# Patient Record
Sex: Male | Born: 1960
Health system: Southern US, Community
[De-identification: ages and names within clinical notes are randomized; demographics above are authoritative.]

## PROBLEM LIST (undated history)

## (undated) DIAGNOSIS — I10 Essential (primary) hypertension: Secondary | ICD-10-CM

## (undated) DIAGNOSIS — N189 Chronic kidney disease, unspecified: Secondary | ICD-10-CM

## (undated) DIAGNOSIS — E785 Hyperlipidemia, unspecified: Secondary | ICD-10-CM

## (undated) DIAGNOSIS — I639 Cerebral infarction, unspecified: Secondary | ICD-10-CM

## (undated) HISTORY — PX: OTHER SURGICAL HISTORY: SHX169

## (undated) HISTORY — PX: LITHOTRIPSY: SUR834

## (undated) HISTORY — DX: Chronic kidney disease, unspecified: N18.9

## (undated) HISTORY — DX: Hyperlipidemia, unspecified: E78.5

## (undated) HISTORY — DX: Cerebral infarction, unspecified: I63.9

## (undated) HISTORY — DX: Essential (primary) hypertension: I10

---

## 1998-02-02 ENCOUNTER — Ambulatory Visit (HOSPITAL_COMMUNITY): Admission: RE | Admit: 1998-02-02 | Discharge: 1998-02-02 | Payer: Self-pay | Admitting: Specialist

## 1998-02-02 ENCOUNTER — Encounter: Payer: Self-pay | Admitting: General Surgery

## 1998-02-16 ENCOUNTER — Encounter: Admission: RE | Admit: 1998-02-16 | Discharge: 1998-03-17 | Payer: Self-pay | Admitting: Specialist

## 2007-08-07 ENCOUNTER — Ambulatory Visit: Payer: Self-pay | Admitting: *Deleted

## 2009-09-25 ENCOUNTER — Ambulatory Visit: Payer: Self-pay | Admitting: Vascular Surgery

## 2010-06-22 NOTE — Procedures (Signed)
CAROTID DUPLEX EXAM   INDICATION:  Left carotid bruit.   HISTORY:  Diabetes:  No.  Cardiac:  No.  Hypertension:  No.  Smoking:  Yes, 1-1/2 packs per day.  Previous Surgery:  No.  CV History:  No.  Amaurosis Fugax No, Paresthesias No, Hemiparesis No                                        RIGHT             LEFT  Brachial systolic pressure:         126               140  Brachial Doppler waveforms:         WNL               WNL  Vertebral direction of flow:        Antegrade (atypical)                Antegrade  DUPLEX VELOCITIES (cm/sec)  CCA peak systolic                   161 (P), 94 (M/D) 102  ECA peak systolic                   147               353  ICA peak systolic                   120               83  ICA end diastolic                   34                34  PLAQUE MORPHOLOGY:                  Mixed             Mixed  PLAQUE AMOUNT:                      Mild-moderate     Mild  PLAQUE LOCATION:                    ICA               ICA, ECA   IMPRESSION:  1. Right 40-59% internal carotid artery stenosis.  2. Left 1-39% internal carotid artery stenosis.  3. Left external carotid artery stenosis.  4. Bilateral antegrade flow in vertebral arteries; however, right      vertebral artery demonstrates early systolic deceleration,      consistent with subclavian stenosis.  5. Bilateral internal carotid artery velocities, essentially unchanged      from that on 07/26/05.   Preliminary report was faxed to Dr. Silvano Rusk office at 4 p.m. on  08/07/07.      ___________________________________________  P. Liliane Bade, M.D.   PB/MEDQ  D:  08/07/2007  T:  08/07/2007  Job:  [id number]

## 2010-06-22 NOTE — Procedures (Signed)
CAROTID DUPLEX EXAM   INDICATION:  Carotid bruit with known carotid stenosis.   HISTORY:  Diabetes:  No.  Cardiac:  No.  Hypertension:  No.  Smoking:  Yes.  Previous Surgery:  No.  CV History:  No.  Amaurosis Fugax No, Paresthesias No, Hemiparesis No                                       RIGHT               LEFT  Brachial systolic pressure:         156                 150  Brachial Doppler waveforms:         WNL                 WNL  Vertebral direction of flow:        Antegrade           Antegrade  DUPLEX VELOCITIES (cm/sec)  CCA peak systolic                   104                 112  ECA peak systolic                   193                 281  ICA peak systolic                   130                 92  ICA end diastolic                   43                  42  PLAQUE MORPHOLOGY:                  Mixed               Mixed  PLAQUE AMOUNT:                      Mild / moderate     Mild  PLAQUE LOCATION:                    Bifurcation/ICA/ECA  Bifurcation/ICA/ECA   IMPRESSION:  1. Right internal carotid artery shows evidence of 40%-59% stenosis      (low end of range).  2. Left internal carotid artery shows no evidence of hemodynamically      significant stenosis, <40%.  3. Bilateral external carotid artery stenosis.  4. No significant changes from previous study 08/07/2007.       ___________________________________________  Larina Earthly, M.D.   AS/MEDQ  D:  09/25/2009  T:  09/25/2009  Job:  [id number]

## 2013-05-06 ENCOUNTER — Ambulatory Visit (HOSPITAL_COMMUNITY)
Admission: RE | Admit: 2013-05-06 | Discharge: 2013-05-06 | Disposition: A | Discharge status: Home / Self Care | Payer: BC Managed Care – PPO | Source: Ambulatory Visit | Attending: Vascular Surgery | Admitting: Vascular Surgery

## 2013-05-06 ENCOUNTER — Other Ambulatory Visit (HOSPITAL_COMMUNITY): Payer: Self-pay | Admitting: Internal Medicine

## 2013-05-06 DIAGNOSIS — R0989 Other specified symptoms and signs involving the circulatory and respiratory systems: Secondary | ICD-10-CM

## 2013-11-11 ENCOUNTER — Encounter: Payer: Self-pay | Admitting: Gastroenterology

## 2013-12-23 ENCOUNTER — Ambulatory Visit (AMBULATORY_SURGERY_CENTER): Payer: Self-pay | Admitting: *Deleted

## 2013-12-23 VITALS — Ht 68.0 in | Wt 229.0 lb

## 2013-12-23 DIAGNOSIS — Z1211 Encounter for screening for malignant neoplasm of colon: Secondary | ICD-10-CM

## 2013-12-23 MED ORDER — MOVIPREP 100 G PO SOLR: 1.0000 | Freq: Once | ORAL | Status: DC

## 2013-12-23 NOTE — Progress Notes (Signed)
No egg or soy allergy. ewm No home 02 use. ewm No diet pills. ewm No issues with past sedation. ewm Declined emmi video. ewm

## 2014-01-06 ENCOUNTER — Encounter: Payer: Self-pay | Admitting: Gastroenterology

## 2014-01-06 ENCOUNTER — Ambulatory Visit (AMBULATORY_SURGERY_CENTER): Payer: BC Managed Care – PPO | Admitting: Gastroenterology

## 2014-01-06 VITALS — BP 117/72 | HR 59 | Temp 97.0°F | Resp 23 | Ht 68.0 in | Wt 229.0 lb

## 2014-01-06 DIAGNOSIS — Z1211 Encounter for screening for malignant neoplasm of colon: Secondary | ICD-10-CM

## 2014-01-06 DIAGNOSIS — K621 Rectal polyp: Secondary | ICD-10-CM

## 2014-01-06 MED ORDER — SODIUM CHLORIDE 0.9 % IV SOLN: 500.0000 mL | INTRAVENOUS | Status: DC

## 2014-01-06 NOTE — Op Note (Signed)
[facility] [address] [place], [id number]   COLONOSCOPY PROCEDURE REPORT  PATIENT: Francisco Gross, Francisco Gross  MR#: [id number] BIRTHDATE: [birth year] , 53  yrs. old GENDER: male ENDOSCOPIST: Rachael Feeaniel P Donshay Lupinski, MD REFERRED HQ:IONGEXBY:Mariel Gaudin Eloise HarmanPaterson, M.D. PROCEDURE DATE:  01/06/2014 PROCEDURE:   Colonoscopy with snare polypectomy First Screening Colonoscopy - Avg.  risk and is 50 yrs.  old or older Yes.  Prior Negative Screening - Now for repeat screening. N/A  History of Adenoma - Now for follow-up colonoscopy & has been > or = to 3 yrs.  N/A  Polyps Removed Today? Yes. ASA CLASS:   Class II INDICATIONS:average risk for colon cancer. MEDICATIONS: Monitored anesthesia care and Propofol 360 mg IV  DESCRIPTION OF PROCEDURE:   After the risks benefits and alternatives of the procedure were thoroughly explained, informed consent was obtained.  The digital rectal exam revealed no abnormalities of the rectum.   The LB [id number]  endoscope was introduced through the anus and advanced to the cecum, which was identified by both the appendix and ileocecal valve. No adverse events experienced.   The quality of the prep was excellent.  The instrument was then slowly withdrawn as the colon was fully examined.   COLON FINDINGS: A sessile polyp measuring 3 mm in size was found in the rectum.  A polypectomy was performed with a cold snare.  The resection was complete, the polyp tissue was completely retrieved and sent to histology.   The examination was otherwise normal. Retroflexed views revealed no abnormalities. The time to cecum=3 minutes 06 seconds.  Withdrawal time=10 minutes 42 seconds.  The scope was withdrawn and the procedure completed. COMPLICATIONS: There were no immediate complications.  ENDOSCOPIC IMPRESSION: 1.   Sessile polyp was found in the rectum; polypectomy was performed with a cold snare 2.   The examination was otherwise normal  RECOMMENDATIONS: If the  polyp(s) removed today are proven to be adenomatous (pre-cancerous) polyps, you will need a repeat colonoscopy in 5 years.  Otherwise you should continue to follow colorectal cancer screening guidelines for "routine risk" patients with colonoscopy in 10 years.  You will receive a letter within 1-2 weeks with the results of your biopsy as well as final recommendations.  Please call my office if you have not received a letter after 3 weeks.  eSigned:  Rachael Feeaniel P Riyanna Crutchley, MD 01/06/2014 10:30 AM

## 2014-01-06 NOTE — Patient Instructions (Addendum)
One of your biggest health concerns is your smoking.  This increases your risk for most cancers and serious cardiovascular diseases such as strokes, heart attacks.  You should try your best to stop.  If you need assistance, please contact your PCP or Smoking Cessation Class at [facility] ([phone]) or [facility] (1-800-QUIT-NOW).   YOU HAD AN ENDOSCOPIC PROCEDURE TODAY AT THE [facility]: Refer to the procedure report that was given to you for any specific questions about what was found during the examination.  If the procedure report does not answer your questions, please call your gastroenterologist to clarify.  If you requested that your care partner not be given the details of your procedure findings, then the procedure report has been included in a sealed envelope for you to review at your convenience later.  YOU SHOULD EXPECT: Some feelings of bloating in the abdomen. Passage of more gas than usual.  Walking can help get rid of the air that was put into your GI tract during the procedure and reduce the bloating. If you had a lower endoscopy (such as a colonoscopy or flexible sigmoidoscopy) you may notice spotting of blood in your stool or on the toilet paper. If you underwent a bowel prep for your procedure, then you may not have a normal bowel movement for a few days.  DIET: Your first meal following the procedure should be a light meal and then it is ok to progress to your normal diet.  A half-sandwich or bowl of soup is an example of a good first meal.  Heavy or fried foods are harder to digest and may make you feel nauseous or bloated.  Likewise meals heavy in dairy and vegetables can cause extra gas to form and this can also increase the bloating.  Drink plenty of fluids but you should avoid alcoholic beverages for 24 hours.  ACTIVITY: Your care partner should take you home directly after the procedure.  You should plan to take it easy, moving slowly for the rest of  the day.  You can resume normal activity the day after the procedure however you should NOT DRIVE or use heavy machinery for 24 hours (because of the sedation medicines used during the test).    SYMPTOMS TO REPORT IMMEDIATELY: A gastroenterologist can be reached at any hour.  During normal business hours, 8:30 AM to 5:00 PM Monday through Friday, call [phone].  After hours and on weekends, please call the GI answering service at [phone] who will take a message and have the physician on call contact you.   Following lower endoscopy (colonoscopy or flexible sigmoidoscopy):  Excessive amounts of blood in the stool  Significant tenderness or worsening of abdominal pains  Swelling of the abdomen that is new, acute  Fever of 100F or higher   FOLLOW UP: If any biopsies were taken you will be contacted by phone or by letter within the next 1-3 weeks.  Call your gastroenterologist if you have not heard about the biopsies in 3 weeks.  Our staff will call the home number listed on your records the next business day following your procedure to check on you and address any questions or concerns that you may have at that time regarding the information given to you following your procedure. This is a courtesy call and so if there is no answer at the home number and we have not heard from you through the emergency physician on call, we will assume that you have   returned to your regular daily activities without incident.  SIGNATURES/CONFIDENTIALITY: You and/or your care partner have signed paperwork which will be entered into your electronic medical record.  These signatures attest to the fact that that the information above on your After Visit Summary has been reviewed and is understood.  Full responsibility of the confidentiality of this discharge information lies with you and/or your care-partner.   INFORMATION ON POLYPS GIVEN TO YOU TODAY 

## 2014-01-06 NOTE — Progress Notes (Signed)
A/ox3, pleased with MAC, report to RN 

## 2014-01-06 NOTE — Progress Notes (Signed)
Called to room to assist during endoscopic procedure.  Patient ID and intended procedure confirmed with present staff. Received instructions for my participation in the procedure from the performing physician.  

## 2014-01-07 ENCOUNTER — Telehealth: Payer: Self-pay | Admitting: *Deleted

## 2014-01-07 NOTE — Telephone Encounter (Signed)
  Follow up Call-  Call back number 01/06/2014  Post procedure Call Back phone  # [phone]  Permission to leave phone message Yes     Patient questions:  Do you have a fever, pain , or abdominal swelling? No. Pain Score  0 *  Have you tolerated food without any problems? Yes.    Have you been able to return to your normal activities? Yes.    Do you have any questions about your discharge instructions: Diet   No. Medications  No. Follow up visit  No.  Do you have questions or concerns about your Care? No.  Actions: * If pain score is 4 or above: No action needed, pain <4.

## 2014-01-09 ENCOUNTER — Encounter: Payer: Self-pay | Admitting: Gastroenterology

## 2019-01-19 DIAGNOSIS — E785 Hyperlipidemia, unspecified: Secondary | ICD-10-CM | POA: Diagnosis not present

## 2019-01-19 DIAGNOSIS — I361 Nonrheumatic tricuspid (valve) insufficiency: Secondary | ICD-10-CM

## 2019-01-19 DIAGNOSIS — I6522 Occlusion and stenosis of left carotid artery: Secondary | ICD-10-CM | POA: Diagnosis not present

## 2019-01-19 DIAGNOSIS — M1990 Unspecified osteoarthritis, unspecified site: Secondary | ICD-10-CM | POA: Diagnosis not present

## 2019-01-19 DIAGNOSIS — R29700 NIHSS score 0: Secondary | ICD-10-CM | POA: Diagnosis not present

## 2019-01-19 DIAGNOSIS — Z7982 Long term (current) use of aspirin: Secondary | ICD-10-CM | POA: Diagnosis not present

## 2019-01-19 DIAGNOSIS — Z79899 Other long term (current) drug therapy: Secondary | ICD-10-CM | POA: Diagnosis not present

## 2019-01-19 DIAGNOSIS — E669 Obesity, unspecified: Secondary | ICD-10-CM | POA: Diagnosis not present

## 2019-01-19 DIAGNOSIS — F17210 Nicotine dependence, cigarettes, uncomplicated: Secondary | ICD-10-CM | POA: Diagnosis not present

## 2019-01-19 DIAGNOSIS — I639 Cerebral infarction, unspecified: Secondary | ICD-10-CM | POA: Diagnosis not present

## 2019-01-19 DIAGNOSIS — R531 Weakness: Secondary | ICD-10-CM | POA: Diagnosis not present

## 2019-01-19 DIAGNOSIS — I10 Essential (primary) hypertension: Secondary | ICD-10-CM | POA: Diagnosis not present

## 2019-01-19 DIAGNOSIS — Z716 Tobacco abuse counseling: Secondary | ICD-10-CM | POA: Diagnosis not present

## 2019-01-28 ENCOUNTER — Encounter: Payer: Self-pay | Admitting: Neurology

## 2019-01-28 DIAGNOSIS — E785 Hyperlipidemia, unspecified: Secondary | ICD-10-CM | POA: Diagnosis not present

## 2019-01-28 DIAGNOSIS — I639 Cerebral infarction, unspecified: Secondary | ICD-10-CM | POA: Diagnosis not present

## 2019-01-28 DIAGNOSIS — Z720 Tobacco use: Secondary | ICD-10-CM | POA: Diagnosis not present

## 2019-01-28 DIAGNOSIS — I10 Essential (primary) hypertension: Secondary | ICD-10-CM | POA: Diagnosis not present

## 2019-02-14 ENCOUNTER — Ambulatory Visit: Payer: Self-pay | Admitting: Cardiology

## 2019-02-15 ENCOUNTER — Other Ambulatory Visit: Payer: Self-pay

## 2019-02-15 ENCOUNTER — Encounter: Payer: Self-pay | Admitting: Cardiology

## 2019-02-15 ENCOUNTER — Ambulatory Visit: Payer: BC Managed Care – PPO | Admitting: Cardiology

## 2019-02-15 VITALS — BP 148/72 | HR 54 | Temp 99.4°F | Ht 68.0 in | Wt 254.0 lb

## 2019-02-15 DIAGNOSIS — R011 Cardiac murmur, unspecified: Secondary | ICD-10-CM | POA: Diagnosis not present

## 2019-02-15 DIAGNOSIS — R0609 Other forms of dyspnea: Secondary | ICD-10-CM | POA: Insufficient documentation

## 2019-02-15 DIAGNOSIS — I10 Essential (primary) hypertension: Secondary | ICD-10-CM | POA: Diagnosis not present

## 2019-02-15 DIAGNOSIS — M79604 Pain in right leg: Secondary | ICD-10-CM | POA: Diagnosis not present

## 2019-02-15 DIAGNOSIS — M79605 Pain in left leg: Secondary | ICD-10-CM

## 2019-02-15 DIAGNOSIS — Z8673 Personal history of transient ischemic attack (TIA), and cerebral infarction without residual deficits: Secondary | ICD-10-CM | POA: Diagnosis not present

## 2019-02-15 DIAGNOSIS — R600 Localized edema: Secondary | ICD-10-CM | POA: Insufficient documentation

## 2019-02-15 MED ORDER — FUROSEMIDE 20 MG PO TABS: 20.0000 mg | ORAL_TABLET | Freq: Every day | ORAL | 3 refills | Status: DC

## 2019-02-15 NOTE — Progress Notes (Signed)
Patient referred by Leanna Battles, MD for cardiac risk stratification  Subjective:   Francisco Gross, male    DOB: 02-02-1961, 59 y.o.   MRN: [id number]   Chief Complaint  Patient presents with  . Stroke Symptoms  . New Patient (Initial Visit)     HPI  59 year old male with hypertension, former smoker, stroke December 2020, referred for cardiac risk stratification.  Patient was lifting heavy objects at work, when he had sudden aphasia and right hand weakness and paresthesias. He was admitted to [facility]. Details of the hospital admission are not available to me other than CT head showing possible left frontal lobe acute infarct. He reportedly also had MRI brain/MRA head/neck, echocardiogram.   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 Since his discharge from hospital, he has quit smoking, and is making changes to his diet and lifestyle.   His other complaints today include pain and "cotton like feeling" in both his feet and hands. Pain in feet does get worse with exertion and get better with rest.   Past Medical History:  Diagnosis Date  . Chronic kidney disease    kidney stones   . Hyperlipidemia   . Hypertension   . Stroke [facility])      Past Surgical History:  Procedure Laterality Date  . basket retrieval of kidney stone    . LITHOTRIPSY       Social History   Tobacco Use  Smoking Status Former Smoker  . Packs/day: 1.50  . Years: 40.00  . Pack years: 60.00  . Types: Cigarettes  . Quit date: 01/18/2019  . Years since quitting: 0.0  Smokeless Tobacco Never Used    Social History   Substance and Sexual Activity  Alcohol Use Yes  . Alcohol/week: 0.0 standard drinks   Comment: occasional     Family History  Problem Relation Age of Onset  . Colon cancer Neg Hx   . Rectal cancer Neg Hx   . Stomach cancer Neg Hx       Current Outpatient Medications on File Prior to Visit  Medication Sig Dispense Refill  . amLODipine (NORVASC) 10 MG tablet Take 10 mg by mouth daily.    Marland Kitchen aspirin EC 81 MG tablet Take 81 mg by mouth daily.    Marland Kitchen atorvastatin (LIPITOR) 80 MG tablet Take 80 mg by mouth daily.    . clopidogrel (PLAVIX) 75 MG tablet Take 75 mg by mouth daily.    Marland Kitchen losartan (COZAAR) 100 MG tablet Take 100 mg by mouth daily.    . metoprolol succinate (TOPROL-XL) 100 MG 24 hr tablet Take 100 mg by mouth 2 (two) times daily.     No current facility-administered medications on file prior to visit.    Cardiovascular and other pertinent studies:   EKG 02/15/2019: Sinus rhythm 51 bpm.  Left atrial enlargement.  Nonspecific T-abnormality.    CT head 01/2019: Possible left frontal lobe acute infarct   Recent labs: 01/21/2019: Glucose 107, BUN/Cr 21/1.1. eGFR >60. Na/K 139/4.4. Rest of the CMP normal. H/H 15.7/46.8. Platelets 162    Review of Systems  Cardiovascular: Positive for dyspnea on exertion. Negative for chest pain, leg swelling, palpitations and syncope.  Musculoskeletal: Positive for back pain.         Vitals:   02/15/19 1053  BP: (!) 148/72  Pulse: (!) 54  Temp: 99.4 F (  37.4 C)  SpO2: 95%     Body mass index is 38.62 kg/m. Filed Weights   02/15/19 1053  Weight: 254 lb (115.2 kg)     Objective:   Physical Exam  Constitutional: He appears well-developed and well-nourished.  Neck: No JVD present.  Cardiovascular: Normal rate, regular rhythm and normal heart sounds.  No murmur heard. Pulses:      Carotid pulses are on the right side with bruit and on the left side with bruit.      Dorsalis pedis pulses are 1+ on the right side and 1+ on the left side.       Posterior tibial pulses are 0 on the right side and 0 on the left side.  Pulmonary/Chest: Effort normal and breath sounds normal. He has no wheezes. He has no rales.  Musculoskeletal:        General: Edema (1-2+  b/l) present.  Nursing note and vitals reviewed.       Assessment & Recommendations:   59 year old male with hypertension, former smoker, stroke December 2020, referred for cardiac risk stratification.  Exertional dyspnea: Most likely due to COPD, but has risk factors for CAD including hypertension, prior tobacco history, recent stroke. Recommend lexiscan nuclear stress test to evaluate for ischemia. Will obtain echocardiogram results. I suspect he may have mild aortic stenosis.   H/o stroke: On Aspirin/plavix, statin. Has scheduled appt with neurology next month. Encouraged him to also discussed his paresthesias.  Hypertension: Suboptimal control. Given his b/l leg edema, added lasix 20 mg daily.  Leg pain: Has risk factors for PAD, absent PT pulse b/l. However, his symptoms of foot pain are unlikely due to claudication. Nonetheless, given his risk factors, will obtain baseline ABI.  B/l carotid bruit: On DAPT and lipitor 80 mg. Will obtain records from [facility].   Diet & Lifestyle recommendations:  Physical activity recommendation (The Physical Activity Guidelines for Americans. JAMA 2018;Nov 12) At least 150-300 minutes a week of moderate-intensity, or 75-150 minutes a week of vigorous-intensity aerobic physical activity, or an equivalent combination of moderate- and vigorous-intensity aerobic activity. Adults should perform muscle-strengthening activities on 2 or more days a week. Older adults should do multicomponent physical activity that includes balance training as well as aerobic and muscle-strengthening activities. Benefits of increased physical activity include lower risk of mortality including cardiovascular mortality, lower risk of cardiovascular events and associated risk factors (hypertension and diabetes), and lower risk of many cancers (including bladder, breast, colon, endometrium, esophagus, kidney, lung, and stomach). Additional improvments have been seen in  cognition, risk of dementia, anxiety and depression, improved bone health, lower risk of falls, and associated injuries.  Dietary recommendation The 2019 ACC/AHA guidelines promote nutrition as a main fixture of cardiovascular wellness, with a recommendation for a varied diet of fruit, vegetables, fish, legumes, and whole grains (Class I), as well as recommendations to reduce sodium, cholesterol, processed meats, and refined sugars (Class IIa recommendation).10 Sodium intake, a topic of some controversy as of late, is recommended to be kept at 1,500 mg/day or less, far below the average daily intake in the [place] of 3,409 mg/day, and notably below that of previous [place] recommendations for <2,338m/day.10,11 For those unable to reach 1,500 mg/day, they recommend at least a reduction of 1000 mg/day.  A Pesco-Mediterranean Diet With Intermittent Fasting: JACC Review Topic of the Week. J Am Coll Cardiol 23474;25:9563-8756Pesco-Mediterranean diet, it is supplemented with extra-virgin olive oil (EVOO), which is the principle fat source, along with moderate amounts of  dairy (particularly yogurt and cheese) and eggs, as well as modest amounts of alcohol consumption (ideally red wine with the evening meal), but few red and processed meats.   Thank you for referring the patient to [place]. Please feel free to contact with any questions.  Nigel Mormon, MD [facility] Pager: [phone] Office: [phone]

## 2019-02-15 NOTE — Patient Instructions (Signed)
Diet & Lifestyle recommendations:  Physical activity recommendation (The Physical Activity Guidelines for Americans. JAMA 2018;Nov 12) At least 150-300 minutes a week of moderate-intensity, or 75-150 minutes a week of vigorous-intensity aerobic physical activity, or an equivalent combination of moderate- and vigorous-intensity aerobic activity. Adults should perform muscle-strengthening activities on 2 or more days a week. Older adults should do multicomponent physical activity that includes balance training as well as aerobic and muscle-strengthening activities. Benefits of increased physical activity include lower risk of mortality including cardiovascular mortality, lower risk of cardiovascular events and associated risk factors (hypertension and diabetes), and lower risk of many cancers (including bladder, breast, colon, endometrium, esophagus, kidney, lung, and stomach). Additional improvments have been seen in cognition, risk of dementia, anxiety and depression, improved bone health, lower risk of falls, and associated injuries.  Dietary recommendation The 2019 ACC/AHA guidelines promote nutrition as a main fixture of cardiovascular wellness, with a recommendation for a varied diet of fruit, vegetables, fish, legumes, and whole grains (Class I), as well as recommendations to reduce sodium, cholesterol, processed meats, and refined sugars (Class IIa recommendation).10 Sodium intake, a topic of some controversy as of late, is recommended to be kept at 1,500 mg/day or less, far below the average daily intake in the [place] of 3,409 mg/day, and notably below that of previous [place] recommendations for <2,300mg/day.10,11 For those unable to reach 1,500 mg/day, they recommend at least a reduction of 1000 mg/day.  A Pesco-Mediterranean Diet With Intermittent Fasting: JACC Review Topic of the Week. J Am Coll Cardiol 2020;76:1484-1493 Pesco-Mediterranean diet, it is supplemented with extra-virgin olive oil (EVOO),  which is the principle fat source, along with moderate amounts of dairy (particularly yogurt and cheese) and eggs, as well as modest amounts of alcohol consumption (ideally red wine with the evening meal), but few red and processed meats.  

## 2019-02-18 ENCOUNTER — Ambulatory Visit (INDEPENDENT_AMBULATORY_CARE_PROVIDER_SITE_OTHER): Payer: BC Managed Care – PPO

## 2019-02-18 ENCOUNTER — Other Ambulatory Visit: Payer: Self-pay

## 2019-02-18 DIAGNOSIS — R0609 Other forms of dyspnea: Secondary | ICD-10-CM

## 2019-02-18 DIAGNOSIS — Z8673 Personal history of transient ischemic attack (TIA), and cerebral infarction without residual deficits: Secondary | ICD-10-CM | POA: Diagnosis not present

## 2019-02-19 ENCOUNTER — Ambulatory Visit (INDEPENDENT_AMBULATORY_CARE_PROVIDER_SITE_OTHER): Payer: BC Managed Care – PPO

## 2019-02-19 ENCOUNTER — Other Ambulatory Visit: Payer: Self-pay

## 2019-02-19 DIAGNOSIS — M79605 Pain in left leg: Secondary | ICD-10-CM

## 2019-02-19 DIAGNOSIS — M79604 Pain in right leg: Secondary | ICD-10-CM

## 2019-02-21 ENCOUNTER — Telehealth: Payer: Self-pay

## 2019-02-21 NOTE — Telephone Encounter (Signed)
LMTCB for results. 

## 2019-02-21 NOTE — Telephone Encounter (Signed)
-----   Message from [facility] [place], MD sent at 02/21/2019  7:22 AM EST ----- No heart muscle perfusion abnormalities were noted.  Regards, Dr. Rosemary Holms

## 2019-02-21 NOTE — Progress Notes (Signed)
Called patient and left a message 

## 2019-02-26 NOTE — Progress Notes (Signed)
LMAM with results.

## 2019-03-01 NOTE — Progress Notes (Signed)
2nd attempt: Called patient to give him results. Francisco Gross

## 2019-03-13 NOTE — Progress Notes (Signed)
NEUROLOGY CONSULTATION NOTE  TAVONE CAESAR MRN: [id number] DOB: Jun 02, 1960  Referring provider: Jarome Matin, MD Primary care provider: Jarome Matin, MD  Reason for consult:  stroke  HISTORY OF PRESENT ILLNESS: Francisco Gross is a 59 year old right-handed white male with hypertension, hyperlipidemia, tobacco abuse and history of kidney stones who presents for stroke.  History supplemented by referring provider's and hospital notes.  On 01/18/2019, he developed sudden onset difficulty forming words that lasted about 10 to 15 minutes.  After speech difficulty resolved, he noticed that he felt general fatigue.  There was no actual weakness.  He has baseline numbness in his fingers and feet, which were unchanged.  Due to persistent symptoms, he presented to [facility] the next day.  He did not receive IV tPA as he was outside the therapeutic window.  CT head in the ED showed small hypodensities in the left frontal lobe and another in the left parietal lobe concerning for infarcts.  Follow up MRI of brain confirmed acute watershed infarcts involving the right frontal and parietal lobes.  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  He had a TEE that did not reveal any cardiac source of emboli.  No atrial fibrillation on telemetry.  Lipid panel showed total cholesterol of 242, TG 243, HDL 32 and LDL 161.4; Hgb U1L was 5.5; TSH was 1.11.  He was evaluated and treated by PT/OT/speech therapy.  He was discharged on ASA and Plavix and atorvastatin 80mg  and restarted on his antihypertensive medications.  He has not smoked a cigarette since the stroke.  Current medications: ASA 81mg , Plavix 75mg , atorvastatin 80mg , amlodipine, losartan, Toprol-XL, Lasix  Still has intermittent right leg instability and hand numbness.  He also reports intermittent dimming of vision in his left eye.  PAST MEDICAL  HISTORY: Past Medical History:  Diagnosis Date  . Chronic kidney disease    kidney stones   . Hyperlipidemia   . Hypertension   . Stroke [facility])     PAST SURGICAL HISTORY: Past Surgical History:  Procedure Laterality Date  . basket retrieval of kidney stone    . LITHOTRIPSY      MEDICATIONS: Current Outpatient Medications on File Prior to Visit  Medication Sig Dispense Refill  . amLODipine (NORVASC) 10 MG tablet Take 10 mg by mouth daily.    aspirin EC 81 MG tablet Take 81 mg by mouth daily.    atorvastatin (LIPITOR) 80 MG tablet Take 80 mg by mouth daily.    . clopidogrel (PLAVIX) 75 MG tablet Take 75 mg by mouth daily.    . furosemide (LASIX) 20 MG tablet Take 1 tablet (20 mg total) by mouth daily. 30 tablet 3  . losartan (COZAAR) 100 MG tablet Take 100 mg by mouth daily.    . metoprolol succinate (TOPROL-XL) 100 MG 24 hr tablet Take 100 mg by mouth 2 (two) times daily.     No current facility-administered medications on file prior to visit.    ALLERGIES: No Known Allergies  FAMILY HISTORY: Family History  Problem Relation Age of Onset  . Breast cancer Mother   . Lung cancer Father   . Breast cancer Sister   . Colon cancer Neg Hx   . Rectal cancer Neg Hx   . Stomach cancer Neg Hx     SOCIAL HISTORY: Social History   Socioeconomic History  . Marital status: Married  Spouse name: Not on file  . Number of children: 2  . Years of education: Not on file  . Highest education level: Not on file  Occupational History  . Not on file  Tobacco Use  . Smoking status: Former Smoker    Packs/day: 1.50    Years: 40.00    Pack years: 60.00    Types: Cigarettes    Quit date: 01/18/2019    Years since quitting: 0.1  . Smokeless tobacco: Never Used  Substance and Sexual Activity  . Alcohol use: Yes    Alcohol/week: 0.0 standard drinks    Comment: occasional  . Drug use: No  . Sexual activity: Not on file  Other Topics Concern  . Not on file  Social History  Narrative  . Not on file   Social Determinants of Health   Financial Resource Strain:   . Difficulty of Paying Living Expenses: Not on file  Food Insecurity:   . Worried About Charity fundraiser in the Last Year: Not on file  . Ran Out of Food in the Last Year: Not on file  Transportation Needs:   . Lack of Transportation (Medical): Not on file  . Lack of Transportation (Non-Medical): Not on file  Physical Activity:   . Days of Exercise per Week: Not on file  . Minutes of Exercise per Session: Not on file  Stress:   . Feeling of Stress : Not on file  Social Connections:   . Frequency of Communication with Friends and Family: Not on file  . Frequency of Social Gatherings with Friends and Family: Not on file  . Attends Religious Services: Not on file  . Active Member of Clubs or Organizations: Not on file  . Attends Archivist Meetings: Not on file  . Marital Status: Not on file  Intimate Partner Violence:   . Fear of Current or Ex-Partner: Not on file  . Emotionally Abused: Not on file  . Physically Abused: Not on file  . Sexually Abused: Not on file    PHYSICAL EXAM: Blood pressure 128/64, pulse 68, height 5\' 8"  (1.727 m), weight 255 lb (115.7 kg), SpO2 96 %. General: No acute distress.  Patient appears well-groomed.  Head:  Normocephalic/atraumatic Eyes:  fundi examined but not visualized Neck: supple, no paraspinal tenderness, full range of motion Back: No paraspinal tenderness Heart: regular rate and rhythm Lungs: Clear to auscultation bilaterally. Vascular: No carotid bruits. Neurological Exam: Mental status: alert and oriented to person, place, and time, recent and remote memory intact, fund of knowledge intact, attention and concentration intact, speech fluent and not dysarthric, language intact. Cranial nerves: CN I: not tested CN II: pupils equal, round and reactive to light, visual fields intact CN III, IV, VI:  full range of motion, no nystagmus, no  ptosis CN V: facial sensation intact CN VII: upper and lower face symmetric CN VIII: hearing intact CN IX, X: gag intact, uvula midline CN XI: sternocleidomastoid and trapezius muscles intact CN XII: tongue midline Bulk & Tone: normal, no fasciculations. Motor:  5/5 throughout Sensation:  Pinprick and vibration sensation intact. Deep Tendon Reflexes:  2+ throughout, toes downgoing.  Finger to nose testing:  Without dysmetria.  Heel to shin:  Without dysmetria.  Gait:  Normal station and stride.  Able to turn and tandem walk. Romberg negative.  IMPRESSION: 1.  Left hemispheric watershed infarcts secondary to left internal carotid artery occlusion. 2.  He still is exhibiting ongoing intermittent left carotid artery syndrome.  I will check CTA of neck ASAP to see if he in fact has a critical stenosis rather than complete occlusion that may require surgical intervention. 3.  Hypertension 4.  Hyperlipidemia 5.  Tobacco abuse  PLAN: 1.  CTA of neck 2.  Continue ASA 81mg  and Plavix 75mg  daily until March 12, after which he should take ASA 81mg  daily alone. 3.  Atorvastatin 80mg  daily (LDL goal less than 70) 4.  Blood pressure control 5.  Mediterranean diet 6.  Routine cardiovascular exercise 7.  Follow up in 4 months.  Thank you for allowing me to take part in the care of this patient.  , DO  CC: 03-08-1982, MD

## 2019-03-15 ENCOUNTER — Ambulatory Visit: Payer: BC Managed Care – PPO | Admitting: Neurology

## 2019-03-15 ENCOUNTER — Encounter: Payer: Self-pay | Admitting: Neurology

## 2019-03-15 ENCOUNTER — Other Ambulatory Visit: Payer: Self-pay

## 2019-03-15 VITALS — BP 128/64 | HR 68 | Ht 68.0 in | Wt 255.0 lb

## 2019-03-15 DIAGNOSIS — I63232 Cerebral infarction due to unspecified occlusion or stenosis of left carotid arteries: Secondary | ICD-10-CM | POA: Diagnosis not present

## 2019-03-15 DIAGNOSIS — G451 Carotid artery syndrome (hemispheric): Secondary | ICD-10-CM | POA: Diagnosis not present

## 2019-03-15 DIAGNOSIS — I10 Essential (primary) hypertension: Secondary | ICD-10-CM

## 2019-03-15 DIAGNOSIS — G453 Amaurosis fugax: Secondary | ICD-10-CM | POA: Diagnosis not present

## 2019-03-15 DIAGNOSIS — E785 Hyperlipidemia, unspecified: Secondary | ICD-10-CM

## 2019-03-15 NOTE — Patient Instructions (Addendum)
1.  We will check CTA of neck 2.  Continue all medications for now. 3.  Follow up in 4 months.   Mediterranean Diet A Mediterranean diet refers to food and lifestyle choices that are based on the traditions of countries located on the [employer]. This way of eating has been shown to help prevent certain conditions and improve outcomes for people who have chronic diseases, like kidney disease and heart disease. What are tips for following this plan? Lifestyle  Cook and eat meals together with your family, when possible.  Drink enough fluid to keep your urine clear or pale yellow.  Be physically active every day. This includes: ? Aerobic exercise like running or swimming. ? Leisure activities like gardening, walking, or housework.  Get 7-8 hours of sleep each night.  If recommended by your health care provider, drink red wine in moderation. This means 1 glass a day for nonpregnant women and 2 glasses a day for men. A glass of wine equals 5 oz (150 mL). Reading food labels   Check the serving size of packaged foods. For foods such as rice and pasta, the serving size refers to the amount of cooked product, not dry.  Check the total fat in packaged foods. Avoid foods that have saturated fat or trans fats.  Check the ingredients list for added sugars, such as corn syrup. Shopping  At the grocery store, buy most of your food from the areas near the walls of the store. This includes: ? Fresh fruits and vegetables (produce). ? Grains, beans, nuts, and seeds. Some of these may be available in unpackaged forms or large amounts (in bulk). ? Fresh seafood. ? Poultry and eggs. ? Low-fat dairy products.  Buy whole ingredients instead of prepackaged foods.  Buy fresh fruits and vegetables in-season from local farmers markets.  Buy frozen fruits and vegetables in resealable bags.  If you do not have access to quality fresh seafood, buy precooked frozen shrimp or canned fish, such as  tuna, salmon, or sardines.  Buy small amounts of raw or cooked vegetables, salads, or olives from the deli or salad bar at your store.  Stock your pantry so you always have certain foods on hand, such as olive oil, canned tuna, canned tomatoes, rice, pasta, and beans. Cooking  Cook foods with extra-virgin olive oil instead of using butter or other vegetable oils.  Have meat as a side dish, and have vegetables or grains as your main dish. This means having meat in small portions or adding small amounts of meat to foods like pasta or stew.  Use beans or vegetables instead of meat in common dishes like chili or lasagna.  Experiment with different cooking methods. Try roasting or broiling vegetables instead of steaming or sauteing them.  Add frozen vegetables to soups, stews, pasta, or rice.  Add nuts or seeds for added healthy fat at each meal. You can add these to yogurt, salads, or vegetable dishes.  Marinate fish or vegetables using olive oil, lemon juice, garlic, and fresh herbs. Meal planning   Plan to eat 1 vegetarian meal one day each week. Try to work up to 2 vegetarian meals, if possible.  Eat seafood 2 or more times a week.  Have healthy snacks readily available, such as: ? Vegetable sticks with hummus. ? [place] yogurt. ? Fruit and nut trail mix.  Eat balanced meals throughout the week. This includes: ? Fruit: 2-3 servings a day ? Vegetables: 4-5 servings a day ? Low-fat dairy:  2 servings a day ? Fish, poultry, or lean meat: 1 serving a day ? Beans and legumes: 2 or more servings a week ? Nuts and seeds: 1-2 servings a day ? Whole grains: 6-8 servings a day ? Extra-virgin olive oil: 3-4 servings a day  Limit red meat and sweets to only a few servings a month What are my food choices?  Mediterranean diet ? Recommended  Grains: Whole-grain pasta. Brown rice. Bulgar wheat. Polenta. Couscous. Whole-wheat bread. Orpah Cobb.  Vegetables: Artichokes. Beets.  Broccoli. Cabbage. Carrots. Eggplant. Green beans. Chard. Kale. Spinach. Onions. Leeks. Peas. Squash. Tomatoes. Peppers. Radishes.  Fruits: Apples. Apricots. Avocado. Berries. Bananas. Cherries. Dates. Figs. Grapes. Lemons. Melon. Oranges. Peaches. Plums. Pomegranate.  Meats and other protein foods: Beans. Almonds. Sunflower seeds. Pine nuts. Peanuts. Cod. Salmon. Scallops. Shrimp. Tuna. Tilapia. Clams. Oysters. Eggs.  Dairy: Low-fat milk. Cheese. Greek yogurt.  Beverages: Water. Red wine. Herbal tea.  Fats and oils: Extra virgin olive oil. Avocado oil. Grape seed oil.  Sweets and desserts: [place] yogurt with honey. Baked apples. Poached pears. Trail mix.  Seasoning and other foods: Basil. Cilantro. Coriander. Cumin. Mint. Parsley. Sage. Rosemary. Tarragon. Garlic. Oregano. Thyme. Pepper. Balsalmic vinegar. Tahini. Hummus. Tomato sauce. Olives. Mushrooms. ? Limit these  Grains: Prepackaged pasta or rice dishes. Prepackaged cereal with added sugar.  Vegetables: Deep fried potatoes (french fries).  Fruits: Fruit canned in syrup.  Meats and other protein foods: Beef. Pork. Lamb. Poultry with skin. Hot dogs. Tomasa Blase.  Dairy: Ice cream. Sour cream. Whole milk.  Beverages: Juice. Sugar-sweetened soft drinks. Beer. Liquor and spirits.  Fats and oils: Butter. Canola oil. Vegetable oil. Beef fat (tallow). Lard.  Sweets and desserts: Cookies. Cakes. Pies. Candy.  Seasoning and other foods: Mayonnaise. Premade sauces and marinades. The items listed may not be a complete list. Talk with your dietitian about what dietary choices are right for you. Summary  The Mediterranean diet includes both food and lifestyle choices.  Eat a variety of fresh fruits and vegetables, beans, nuts, seeds, and whole grains.  Limit the amount of red meat and sweets that you eat.  Talk with your health care provider about whether it is safe for you to drink red wine in moderation. This means 1 glass a day for  nonpregnant women and 2 glasses a day for men. A glass of wine equals 5 oz (150 mL). This information is not intended to replace advice given to you by your health care provider. Make sure you discuss any questions you have with your health care provider. Document Revised: 09/24/2015 Document Reviewed: 09/17/2015 Elsevier Patient Education  2020 [employer].   We have sent a referral to [facility] for your CTA and they will call you directly to schedule your appointment. They are located at [address] [facility]. If you need to contact them directly please call [phone].

## 2019-04-05 ENCOUNTER — Other Ambulatory Visit: Payer: Self-pay

## 2019-04-05 ENCOUNTER — Ambulatory Visit
Admission: RE | Admit: 2019-04-05 | Discharge: 2019-04-05 | Disposition: A | Discharge status: Home / Self Care | Payer: BC Managed Care – PPO | Source: Ambulatory Visit | Attending: Neurology | Admitting: Neurology

## 2019-04-05 DIAGNOSIS — I63232 Cerebral infarction due to unspecified occlusion or stenosis of left carotid arteries: Secondary | ICD-10-CM

## 2019-04-05 DIAGNOSIS — E785 Hyperlipidemia, unspecified: Secondary | ICD-10-CM

## 2019-04-05 DIAGNOSIS — I10 Essential (primary) hypertension: Secondary | ICD-10-CM

## 2019-04-05 DIAGNOSIS — G453 Amaurosis fugax: Secondary | ICD-10-CM

## 2019-04-05 DIAGNOSIS — G451 Carotid artery syndrome (hemispheric): Secondary | ICD-10-CM

## 2019-04-05 MED ORDER — IOPAMIDOL (ISOVUE-370) INJECTION 76%
75.0000 mL | Freq: Once | INTRAVENOUS | Status: AC | PRN
  Administered 2019-04-05: 75 mL via INTRAVENOUS

## 2019-05-06 DIAGNOSIS — Z7982 Long term (current) use of aspirin: Secondary | ICD-10-CM | POA: Diagnosis not present

## 2019-05-06 DIAGNOSIS — E876 Hypokalemia: Secondary | ICD-10-CM | POA: Diagnosis not present

## 2019-05-06 DIAGNOSIS — E7800 Pure hypercholesterolemia, unspecified: Secondary | ICD-10-CM | POA: Diagnosis not present

## 2019-05-06 DIAGNOSIS — J9601 Acute respiratory failure with hypoxia: Secondary | ICD-10-CM | POA: Diagnosis not present

## 2019-05-06 DIAGNOSIS — I110 Hypertensive heart disease with heart failure: Secondary | ICD-10-CM | POA: Diagnosis not present

## 2019-05-06 DIAGNOSIS — I5033 Acute on chronic diastolic (congestive) heart failure: Secondary | ICD-10-CM | POA: Diagnosis not present

## 2019-05-06 DIAGNOSIS — E662 Morbid (severe) obesity with alveolar hypoventilation: Secondary | ICD-10-CM | POA: Diagnosis not present

## 2019-05-06 DIAGNOSIS — Z8673 Personal history of transient ischemic attack (TIA), and cerebral infarction without residual deficits: Secondary | ICD-10-CM | POA: Diagnosis not present

## 2019-05-06 DIAGNOSIS — R0600 Dyspnea, unspecified: Secondary | ICD-10-CM | POA: Diagnosis not present

## 2019-05-06 DIAGNOSIS — F17210 Nicotine dependence, cigarettes, uncomplicated: Secondary | ICD-10-CM | POA: Diagnosis not present

## 2019-05-06 DIAGNOSIS — I509 Heart failure, unspecified: Secondary | ICD-10-CM | POA: Diagnosis not present

## 2019-05-06 DIAGNOSIS — R0602 Shortness of breath: Secondary | ICD-10-CM | POA: Diagnosis not present

## 2019-05-06 DIAGNOSIS — I350 Nonrheumatic aortic (valve) stenosis: Secondary | ICD-10-CM | POA: Diagnosis not present

## 2019-05-06 DIAGNOSIS — J449 Chronic obstructive pulmonary disease, unspecified: Secondary | ICD-10-CM | POA: Diagnosis not present

## 2019-05-06 DIAGNOSIS — R911 Solitary pulmonary nodule: Secondary | ICD-10-CM | POA: Diagnosis not present

## 2019-05-06 DIAGNOSIS — M1990 Unspecified osteoarthritis, unspecified site: Secondary | ICD-10-CM | POA: Diagnosis not present

## 2019-05-06 DIAGNOSIS — R001 Bradycardia, unspecified: Secondary | ICD-10-CM | POA: Diagnosis not present

## 2019-05-06 DIAGNOSIS — J90 Pleural effusion, not elsewhere classified: Secondary | ICD-10-CM | POA: Diagnosis not present

## 2019-05-06 DIAGNOSIS — E8881 Metabolic syndrome: Secondary | ICD-10-CM | POA: Diagnosis not present

## 2019-05-06 DIAGNOSIS — Z79899 Other long term (current) drug therapy: Secondary | ICD-10-CM | POA: Diagnosis not present

## 2019-05-06 DIAGNOSIS — R591 Generalized enlarged lymph nodes: Secondary | ICD-10-CM | POA: Diagnosis not present

## 2019-05-07 DIAGNOSIS — R0600 Dyspnea, unspecified: Secondary | ICD-10-CM | POA: Diagnosis not present

## 2019-05-07 DIAGNOSIS — I509 Heart failure, unspecified: Secondary | ICD-10-CM | POA: Diagnosis not present

## 2019-05-08 DIAGNOSIS — I350 Nonrheumatic aortic (valve) stenosis: Secondary | ICD-10-CM | POA: Diagnosis not present

## 2019-05-08 DIAGNOSIS — R0600 Dyspnea, unspecified: Secondary | ICD-10-CM | POA: Diagnosis not present

## 2019-05-09 DIAGNOSIS — R0600 Dyspnea, unspecified: Secondary | ICD-10-CM | POA: Diagnosis not present

## 2019-07-05 NOTE — Progress Notes (Signed)
Patient referred by Leanna Battles, MD for cardiac risk stratification  Subjective:   Charlestine Massed, male    DOB: 22-Jul-1960, 59 y.o.   MRN: [id number]  Chief Complaint  Patient presents with  . Shortness of Breath  . Follow-up    6 month    HPI  59 y.o. male with hypertension, former smoker, stroke (01/2019), suspected HFpEF  Patient was hospitalized at [facility] in March 2021 with congestive heart failure.  I do not have the records available from that hospitalization.  His Lasix was increased to 40 mg daily.  However, he reportedly had lightheadedness episode.  Therefore, his Lasix was reduced back to 20 mg daily by his PCP.  More recently, he has noticed increase in his blood pressure.  He has exertional dyspnea and occasional leg edema.  He denies any chest pain.   Initial consultation HPI 02/15/2019: Patient was lifting heavy objects at work, when he had sudden aphasia and right hand weakness and paresthesias. He was admitted to [facility]. Details of the hospital admission are not available to me other than CT head showing possible left frontal lobe acute infarct. He reportedly also had MRI brain/MRA head/neck, echocardiogram.   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 Since his discharge from hospital, he has quit smoking, and is making changes to his diet and lifestyle.   His other complaints today include pain and "cotton like feeling" in both his feet and hands. Pain in feet does get worse with exertion and get better with rest.    Current Outpatient Medications on File Prior to Visit  Medication Sig Dispense Refill  . aspirin EC 81 MG tablet Take 81 mg by mouth daily.    Marland Kitchen atorvastatin (LIPITOR) 80 MG tablet Take 80 mg by mouth daily.    . furosemide (LASIX) 20 MG tablet Take 1 tablet (20 mg total) by mouth daily. 30 tablet 3  .  losartan (COZAAR) 100 MG tablet Take 100 mg by mouth daily.     No current facility-administered medications on file prior to visit.    Cardiovascular and other pertinent studies:  EKG 07/10/2019: Sinus rhythm 84 bpm Left atrial enlargement.  Possible left ventricular hypertrophy. Lateral T wave inversion, likely hypertensive changes.    CT Angio Neck 04/05/2019: Occlusion of the cervical left ICA. At least partial reconstitution intracranially at the level of the proximal cavernous segment. Remainder of intracranial portion is not included. Plaque at the right ICA origin with approximately 50% stenosis. Nonspecific left maxillary sinus air-fluid level, which can reflect acute sinusitis in the appropriate clinical setting.   ABI 02/19/2019:  This exam reveals normal perfusion of the right lower extremity (ABI 1.00). This exam reveals normal perfusion of the left lower extremity (ABI 1.00).    Lexiscan Sestamibi Stress Test 02/18/2019: Nondiagnostic ECG stress due to pharmacologic stress. oft tissue attenuation in the anterior wall. Normal myocardial perfusion. without ischemia or scar All segments of left ventricle demonstrated normal wall motion and thickening. Stress LV EF is normal 57%.  No previous exam available for comparison. Low risk study.   EKG 02/15/2019: Sinus rhythm 51 bpm.  Left atrial enlargement.  Nonspecific T-abnormality.    CT head 01/2019: Possible left frontal lobe acute infarct   Recent labs: 01/21/2019: Glucose 107, BUN/Cr 21/1.1. eGFR >60. Na/K 139/4.4. Rest of the CMP normal. H/H 15.7/46.8. Platelets  162    Review of Systems  Cardiovascular: Positive for dyspnea on exertion. Negative for chest pain, leg swelling, palpitations and syncope.  Musculoskeletal: Positive for back pain.         Vitals:   07/10/19 0843 07/10/19 0847  BP: (!) 178/95 (!) 168/89  Pulse: 90 88  Resp: 17   SpO2: 99% 98%     Body mass index is 39.38  kg/m. Filed Weights   07/10/19 0843  Weight: 259 lb (117.5 kg)     Objective:   Physical Exam  Constitutional: He appears well-developed and well-nourished.  Neck: No JVD present.  Cardiovascular: Normal rate, regular rhythm and normal heart sounds.  No murmur heard. Pulses:      Carotid pulses are on the right side with bruit and on the left side with bruit.      Dorsalis pedis pulses are 1+ on the right side and 1+ on the left side.       Posterior tibial pulses are 0 on the right side and 0 on the left side.  Pulmonary/Chest: Effort normal and breath sounds normal. He has no wheezes. He has no rales.  Musculoskeletal:        General: Edema (1+ b/l) present.  Nursing note and vitals reviewed.       Assessment & Recommendations:   59 y.o. male with hypertension, former smoker, stroke (01/2019), suspected HFpEF  Exertional dyspnea: In addition to COPD, I suspect he may have HFpEF.  Will obtain echocardiogram. Started spironolactone 50 mg daily.  Check BMP and BNP in 1 week.  Continue Lasix 20 mg daily and losartan 100 mg daily.  H/o stroke: On Aspirin 81, lipitor 80. Continue f/u w/neurology.  Hypertension: Suboptimal control.  Added spironolactone 50 mg daily.   B/l carotid bruit: Occluded left cervical ICA, 50% plaque at Rt ICA.  Follow-up in 4 weeks  Makayle Krahn Esther Hardy, MD [facility] [place] Pager: [phone] Office: [phone]

## 2019-07-10 ENCOUNTER — Ambulatory Visit: Payer: BC Managed Care – PPO | Admitting: Cardiology

## 2019-07-10 ENCOUNTER — Other Ambulatory Visit: Payer: Self-pay

## 2019-07-10 ENCOUNTER — Encounter: Payer: Self-pay | Admitting: Cardiology

## 2019-07-10 VITALS — BP 168/89 | HR 88 | Resp 17 | Ht 68.0 in | Wt 259.0 lb

## 2019-07-10 DIAGNOSIS — Z8673 Personal history of transient ischemic attack (TIA), and cerebral infarction without residual deficits: Secondary | ICD-10-CM

## 2019-07-10 DIAGNOSIS — I5032 Chronic diastolic (congestive) heart failure: Secondary | ICD-10-CM

## 2019-07-10 DIAGNOSIS — R0609 Other forms of dyspnea: Secondary | ICD-10-CM

## 2019-07-10 DIAGNOSIS — I10 Essential (primary) hypertension: Secondary | ICD-10-CM

## 2019-07-10 MED ORDER — SPIRONOLACTONE 50 MG PO TABS: 50.0000 mg | ORAL_TABLET | Freq: Every day | ORAL | 2 refills | Status: DC

## 2019-07-18 ENCOUNTER — Other Ambulatory Visit: Payer: Self-pay

## 2019-07-18 ENCOUNTER — Ambulatory Visit: Payer: BC Managed Care – PPO

## 2019-07-18 DIAGNOSIS — I5032 Chronic diastolic (congestive) heart failure: Secondary | ICD-10-CM

## 2019-07-18 DIAGNOSIS — I10 Essential (primary) hypertension: Secondary | ICD-10-CM | POA: Diagnosis not present

## 2019-07-19 LAB — BRAIN NATRIURETIC PEPTIDE: BNP: 9.2 pg/mL (ref 0.0–100.0)

## 2019-08-01 NOTE — Progress Notes (Deleted)
NEUROLOGY FOLLOW UP OFFICE NOTE  Francisco Gross [id number]  HISTORY OF PRESENT ILLNESS: Francisco Gross is a 59 year old right-handed white male with hypertension, hyperlipidemia, tobacco abuse and history of kidney stones who follows up for stroke.  UPDATE: Current medications: ASA 81mg , Plavix 75mg , atorvastatin 80mg , amlodipine, losartan, Toprol-XL, Lasix  CTA of neck on 04/05/2019 personally reviewed confirmed occlusion of the left cervical ICA with at least partial reconstitution intracranial at the level of the proximal cavernous segment.  Still has intermittent right leg instability and hand numbness.  He also reports intermittent dimming of vision in his left eye.  HISTORY: On 01/18/2019, he developed sudden onset difficulty forming words that lasted about 10 to 15 minutes.  After speech difficulty resolved, he notic ed that he felt general fatigue.  There was no actual weakness.  He has baseline numbness in his fingers and feet, which were unchanged.  Due to persistent symptoms, he presented to [facility] the next day.  He did not receive IV tPA as he was outside the therapeutic window.  CT head in the ED showed small hypodensities in the left frontal lobe and another in the left parietal lobe concerning for infarcts.  Follow up MRI of brain confirmed acute watershed infarcts involving the right frontal and parietal lobes.  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  He had a TEE that did not reveal any cardiac source of emboli.  No atrial fibrillation on telemetry.  Lipid panel showed total cholesterol of 242, TG 243, HDL 32 and LDL 161.4; Hgb A1c was 5.5; TSH was 1.11.  He was evaluated and treated by PT/OT/speech therapy.  He was discharged on ASA and Plavix and atorvastatin 80mg  and restarted on his antihypertensive medications.  He has not smoked a cigarette since the stroke.   PAST  MEDICAL HISTORY: Past Medical History:  Diagnosis Date  . Chronic kidney disease    kidney stones   . Hyperlipidemia   . Hypertension   . Stroke [facility])     MEDICATIONS: Current Outpatient Medications on File Prior to Visit  Medication Sig Dispense Refill  . aspirin EC 81 MG tablet Take 81 mg by mouth daily.    Marland Kitchen atorvastatin (LIPITOR) 80 MG tablet Take 80 mg by mouth daily.    . furosemide (LASIX) 20 MG tablet Take 1 tablet (20 mg total) by mouth daily. 30 tablet 3  . losartan (COZAAR) 100 MG tablet Take 100 mg by mouth daily.    Marland Kitchen spironolactone (ALDACTONE) 50 MG tablet Take 1 tablet (50 mg total) by mouth daily. 60 tablet 2   No current facility-administered medications on file prior to visit.    ALLERGIES: No Known Allergies  FAMILY HISTORY: Family History  Problem Relation Age of Onset  . Breast cancer Mother   . Lung cancer Father   . Breast cancer Sister   . Colon cancer Neg Hx   . Rectal cancer Neg Hx   . Stomach cancer Neg Hx     SOCIAL HISTORY: Social History   Socioeconomic History  . Marital status: Married    Spouse name: Not on file  . Number of children: 2  . Years of education: Not on file  . Highest education level: Not on file  Occupational History  . Not on file  Tobacco Use  . Smoking status: Former Smoker    Packs/day: 1.50  Years: 40.00    Pack years: 60.00    Types: Cigarettes    Quit date: 01/18/2019    Years since quitting: 0.5  . Smokeless tobacco: Never Used  Vaping Use  . Vaping Use: Never used  Substance and Sexual Activity  . Alcohol use: Not Currently    Alcohol/week: 0.0 standard drinks  . Drug use: No  . Sexual activity: Not on file  Other Topics Concern  . Not on file  Social History Narrative   Right handed   One story home   Drinks one cup of coffe daily   Social Determinants of Health   Financial Resource Strain:   . Difficulty of Paying Living Expenses:   Food Insecurity:   . Worried About Brewing technologist in the Last Year:   . Barista in the Last Year:   Transportation Needs:   . Freight forwarder (Medical):   Marland Kitchen Lack of Transportation (Non-Medical):   Physical Activity:   . Days of Exercise per Week:   . Minutes of Exercise per Session:   Stress:   . Feeling of Stress :   Social Connections:   . Frequency of Communication with Friends and Family:   . Frequency of Social Gatherings with Friends and Family:   . Attends Religious Services:   . Active Member of Clubs or Organizations:   . Attends Banker Meetings:   Marland Kitchen Marital Status:   Intimate Partner Violence:   . Fear of Current or Ex-Partner:   . Emotionally Abused:   Marland Kitchen Physically Abused:   . Sexually Abused:     PHYSICAL EXAM: *** General: No acute distress.  Patient appears well-groomed.   Head:  Normocephalic/atraumatic Eyes:  Fundi examined but not visualized Neck: supple, no paraspinal tenderness, full range of motion Heart:  Regular rate and rhythm Lungs:  Clear to auscultation bilaterally Back: No paraspinal tenderness Neurological Exam: alert and oriented to person, place, and time. Attention span and concentration intact, recent and remote memory intact, fund of knowledge intact.  Speech fluent and not dysarthric, language intact.  CN II-XII intact. Bulk and tone normal, muscle strength 5/5 throughout.  Sensation to light touch, temperature and vibration intact.  Deep tendon reflexes 2+ throughout, toes downgoing.  Finger to nose and heel to shin testing intact.  Gait normal, Romberg negative.  IMPRESSION: 1.  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 2.  Hypertension 3.  Hyperlipidemia 4.  Tobacco abuse  PLAN: Secondary stroke prevention as managed by PCP: 1.  ASA 81mg  daily for secondary stroke prevention 2.  Atorvastatin 80mg  daily (LDL goal less than 70) 3.  Blood pressure goal 130-150 systolic to ensure adequate perfusion of collaterals  from the occluded left ICA. 4.  Tobacco cessation 5.  Mediterranean diet 6.  Routine exercise 7.  Follow up in 6 months.  , DO  CC: , MD

## 2019-08-05 ENCOUNTER — Ambulatory Visit: Payer: BC Managed Care – PPO | Admitting: Neurology

## 2019-08-08 ENCOUNTER — Encounter: Payer: Self-pay | Admitting: Cardiology

## 2019-08-08 ENCOUNTER — Ambulatory Visit: Payer: BC Managed Care – PPO | Admitting: Cardiology

## 2019-08-08 ENCOUNTER — Other Ambulatory Visit: Payer: Self-pay

## 2019-08-08 VITALS — BP 154/74 | HR 88 | Resp 16 | Ht 68.0 in | Wt 261.0 lb

## 2019-08-08 DIAGNOSIS — Z8673 Personal history of transient ischemic attack (TIA), and cerebral infarction without residual deficits: Secondary | ICD-10-CM | POA: Diagnosis not present

## 2019-08-08 DIAGNOSIS — R0609 Other forms of dyspnea: Secondary | ICD-10-CM

## 2019-08-08 DIAGNOSIS — I5032 Chronic diastolic (congestive) heart failure: Secondary | ICD-10-CM | POA: Diagnosis not present

## 2019-08-08 DIAGNOSIS — I10 Essential (primary) hypertension: Secondary | ICD-10-CM

## 2019-08-08 NOTE — Progress Notes (Signed)
Patient referred by Leanna Battles, MD for cardiac risk stratification  Subjective:   Charlestine Massed, male    DOB: May 02, 1960, 59 y.o.   MRN: [id number]  Chief Complaint  Patient presents with   Congestive Heart Failure   Follow-up    4 week    HPI  59 y.o. male with hypertension, former smoker, stroke (01/2019), exertional dyspnea  Patient was hospitalized at [facility] in March 2021 with congestive heart failure.  I do not have the records available from that hospitalization.  His Lasix was increased to 40 mg daily.  However, he reportedly had lightheadedness episode.  Therefore, his Lasix was reduced back to 20 mg daily by his PCP.  More recently, he has noticed increase in his blood pressure.  He has exertional dyspnea and occasional leg edema.  He denies any chest pain. Echocardiogram in 07/2019 showed indeterminate diastolic function, elevated LAP. BNP was surprisingly low at 9.   Symptoms have improved after being on diuretic.  However, he is unable to reliably recall his medications today.  Blood pressure improved, but remains elevated.   Initial consultation HPI 02/15/2019: Patient was lifting heavy objects at work, when he had sudden aphasia and right hand weakness and paresthesias. He was admitted to [facility]. Details of the hospital admission are not available to me other than CT head showing possible left frontal lobe acute infarct. He reportedly also had MRI brain/MRA head/neck, echocardiogram.   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 Since his discharge from hospital, he has quit smoking, and is making changes to his diet and lifestyle.   His other complaints today include pain and "cotton like feeling" in both his feet and hands. Pain in feet does get worse with exertion and get better with rest.    Current Outpatient  Medications on File Prior to Visit  Medication Sig Dispense Refill   aspirin EC 81 MG tablet Take 81 mg by mouth daily.     atorvastatin (LIPITOR) 80 MG tablet Take 80 mg by mouth daily.     furosemide (LASIX) 20 MG tablet Take 1 tablet (20 mg total) by mouth daily. 30 tablet 3   losartan (COZAAR) 100 MG tablet Take 100 mg by mouth daily.     spironolactone (ALDACTONE) 50 MG tablet Take 1 tablet (50 mg total) by mouth daily. 60 tablet 2   No current facility-administered medications on file prior to visit.    Cardiovascular and other pertinent studies:  Echocardiogram 07/18/2019:  1. Normal LV systolic function with visual EF 55-60%. Left ventricle  cavity is normal in size. Moderate hypertrophy of the  left ventricle. Normal global wall motion. Indeterminate diastolic filling  pattern, elevated LAP.  2. Mild (Grade I) mitral regurgitation.  3. Mild tricuspid regurgitation.  4. No prior study for comparison.  EKG 07/10/2019: Sinus rhythm 84 bpm Left atrial enlargement.  Possible left ventricular hypertrophy. Lateral T wave inversion, likely hypertensive changes.    CT Angio Neck 04/05/2019: Occlusion of the cervical left ICA. At least partial reconstitution intracranially at the level of the proximal cavernous segment. Remainder of intracranial portion is not included. Plaque at the right ICA origin with approximately 50% stenosis. Nonspecific left maxillary sinus air-fluid level, which can reflect acute sinusitis in the appropriate clinical setting.   ABI 02/19/2019:  This exam reveals normal perfusion of the right lower  extremity (ABI 1.00). This exam reveals normal perfusion of the left lower extremity (ABI 1.00).    Lexiscan Sestamibi Stress Test 02/18/2019: Nondiagnostic ECG stress due to pharmacologic stress. oft tissue attenuation in the anterior wall. Normal myocardial perfusion. without ischemia or scar All segments of left ventricle demonstrated normal wall  motion and thickening. Stress LV EF is normal 57%.  No previous exam available for comparison. Low risk study.   CT head 01/2019: Possible left frontal lobe acute infarct   Recent labs: 07/18/2019: BNP 9 normal  01/21/2019: Glucose 107, BUN/Cr 21/1.1. eGFR >60. Na/K 139/4.4. Rest of the CMP normal. H/H 15.7/46.8. Platelets 162    Review of Systems  Cardiovascular: Positive for dyspnea on exertion. Negative for chest pain, leg swelling, palpitations and syncope.  Musculoskeletal: Positive for back pain.         Vitals:   08/08/19 1044  BP: (!) 154/74  Pulse: 88  Resp: 16  SpO2: 96%     Body mass index is 39.68 kg/m. Filed Weights   08/08/19 1044  Weight: 261 lb (118.4 kg)     Objective:   Physical Exam Vitals and nursing note reviewed.  Constitutional:      Appearance: He is well-developed.  Neck:     Vascular: No JVD.  Cardiovascular:     Rate and Rhythm: Normal rate and regular rhythm.     Pulses:          Carotid pulses are on the right side with bruit and on the left side with bruit.      Dorsalis pedis pulses are 1+ on the right side and 1+ on the left side.       Posterior tibial pulses are 0 on the right side and 0 on the left side.     Heart sounds: Normal heart sounds. No murmur heard.   Pulmonary:     Effort: Pulmonary effort is normal.     Breath sounds: Normal breath sounds. No wheezing or rales.         Assessment & Recommendations:   59 y.o. male with hypertension, former smoker, stroke (01/2019), exertional dyspnea  Exertional dyspnea: COPD, plus probable HFpEF. Although BNP is normal, echocardiogram (07/2019) showed elevated LAP. He will bring his medications at next visit to verify.  I had started him on spironolactone 50 mg daily and continue Lasix 20 mg daily and losartan 100 mg daily.  Will check BMP.  Follow-up in 4 weeks.  H/o stroke: On Aspirin 81, lipitor 80. Continue f/u w/neurology.  Hypertension: As above.    B/l  carotid bruit: Occluded left cervical ICA, 50% plaque at Rt ICA.  Follow-up in 4 weeks  Tarig Zimmers Esther Hardy, MD [facility]. [place] Pager: [phone] Office: [phone]

## 2019-08-15 ENCOUNTER — Ambulatory Visit: Payer: BC Managed Care – PPO | Admitting: Cardiology

## 2019-09-12 NOTE — Progress Notes (Signed)
Patient referred by Leanna Battles, MD for cardiac risk stratification  Subjective:   Francisco Gross, male    DOB: Dec 18, 1960, 59 y.o.   MRN: [id number]   Chief Complaint  Patient presents with  . Hypertension  . Follow-up    4 week    HPI  59 y.o. male with hypertension, former smoker, stroke (01/2019), exertional dyspnea  Patient was hospitalized at [facility] in March 2021 with congestive heart failure.  I do not have the records available from that hospitalization.  His Lasix was increased to 40 mg daily.  However, he reportedly had lightheadedness episode.  Therefore, his Lasix was reduced back to 20 mg daily by his PCP.  More recently, he has noticed increase in his blood pressure.  He has exertional dyspnea and occasional leg edema.  He denies any chest pain. Echocardiogram in 07/2019 showed indeterminate diastolic function, elevated LAP. BNP was surprisingly low at 9. Symptoms have improved after being on diuretic.    Overall, is doing well without overt episodes of exertional dyspnea, leg edema.  Blood pressure elevated in office today.  Compared with his home blood pressure monitor, remains about the same.  Blood pressure readings are much lower.  Recent laboratory PCP, not available to me.  Current Outpatient Medications on File Prior to Visit  Medication Sig Dispense Refill  . aspirin EC 81 MG tablet Take 81 mg by mouth daily.    Marland Kitchen atorvastatin (LIPITOR) 80 MG tablet Take 80 mg by mouth daily.    . furosemide (LASIX) 20 MG tablet Take 1 tablet (20 mg total) by mouth daily. 30 tablet 3  . losartan (COZAAR) 100 MG tablet Take 100 mg by mouth daily.    Marland Kitchen spironolactone (ALDACTONE) 50 MG tablet Take 1 tablet (50 mg total) by mouth daily. 60 tablet 2   No current facility-administered medications on file prior to visit.    Cardiovascular and other pertinent studies:  Echocardiogram 07/18/2019:  1. Normal LV systolic function with visual EF 55-60%. Left ventricle   cavity is normal in size. Moderate hypertrophy of the  left ventricle. Normal global wall motion. Indeterminate diastolic filling  pattern, elevated LAP.  2. Mild (Grade I) mitral regurgitation.  3. Mild tricuspid regurgitation.  4. No prior study for comparison.  EKG 07/10/2019: Sinus rhythm 84 bpm Left atrial enlargement.  Possible left ventricular hypertrophy. Lateral T wave inversion, likely hypertensive changes.    CT Angio Neck 04/05/2019: Occlusion of the cervical left ICA. At least partial reconstitution intracranially at the level of the proximal cavernous segment. Remainder of intracranial portion is not included. Plaque at the right ICA origin with approximately 50% stenosis. Nonspecific left maxillary sinus air-fluid level, which can reflect acute sinusitis in the appropriate clinical setting.   ABI 02/19/2019:  This exam reveals normal perfusion of the right lower extremity (ABI 1.00). This exam reveals normal perfusion of the left lower extremity (ABI 1.00).    Lexiscan Sestamibi Stress Test 02/18/2019: Nondiagnostic ECG stress due to pharmacologic stress. oft tissue attenuation in the anterior wall. Normal myocardial perfusion. without ischemia or scar All segments of left ventricle demonstrated normal wall motion and thickening. Stress LV EF is normal 57%.  No previous exam available for comparison. Low risk study.   CT head 01/2019: Possible left frontal lobe acute infarct   Recent labs: 07/18/2019: BNP 9 normal  01/21/2019: Glucose 107, BUN/Cr 21/1.1. eGFR >60. Na/K 139/4.4. Rest of the CMP normal. H/H 15.7/46.8. Platelets 162  Review of Systems  Cardiovascular: Positive for dyspnea on exertion (Improved). Negative for chest pain, leg swelling, palpitations and syncope.  Musculoskeletal: Positive for back pain.         Vitals:   09/13/19 0846 09/13/19 0854  BP: (!) 189/98 (!) 179/82  Pulse: 86 82  Resp: 16   SpO2: 100%      Body mass  index is 40.9 kg/m. Filed Weights   09/13/19 0846  Weight: 269 lb (122 kg)     Objective:   Physical Exam Vitals and nursing note reviewed.  Constitutional:      Appearance: He is well-developed.  Neck:     Vascular: No JVD.  Cardiovascular:     Rate and Rhythm: Normal rate and regular rhythm.     Pulses:          Carotid pulses are on the right side with bruit and on the left side with bruit.      Dorsalis pedis pulses are 1+ on the right side and 1+ on the left side.       Posterior tibial pulses are 0 on the right side and 0 on the left side.     Heart sounds: Normal heart sounds. No murmur heard.   Pulmonary:     Effort: Pulmonary effort is normal.     Breath sounds: Normal breath sounds. No wheezing or rales.         Assessment & Recommendations:   59 y.o. male with hypertension, former smoker, stroke (01/2019), exertional dyspnea  Exertional dyspnea: COPD, plus probable HFpEF. Although BNP is normal, echocardiogram (07/2019) showed elevated LAP. Continue current antihypertensive therapy.  H/o stroke: On Aspirin 81, lipitor 80. Continue f/u w/neurology.  Hypertension: Elevated today, but usually well controlled.  Continue spironolactone 50 mg, statin 100 mg, Lasix 20 mg daily.   If home blood pressures consistently read > 140/85 mmHg, creatinine beta-blocker or amlodipine.  B/l carotid bruit: Occluded left cervical ICA, 50% plaque at Rt ICA.  We will obtain labs from PCP office.  F/u in 6 months   Lynnett Langlinais Esther Hardy, MD [facility] [place] Pager: [phone] Office: [phone]

## 2019-09-13 ENCOUNTER — Encounter: Payer: Self-pay | Admitting: Cardiology

## 2019-09-13 ENCOUNTER — Ambulatory Visit: Payer: BC Managed Care – PPO | Admitting: Cardiology

## 2019-09-13 ENCOUNTER — Other Ambulatory Visit: Payer: Self-pay

## 2019-09-13 VITALS — BP 179/82 | HR 82 | Resp 16 | Ht 68.0 in | Wt 269.0 lb

## 2019-09-13 DIAGNOSIS — Z8673 Personal history of transient ischemic attack (TIA), and cerebral infarction without residual deficits: Secondary | ICD-10-CM | POA: Diagnosis not present

## 2019-09-13 DIAGNOSIS — I5032 Chronic diastolic (congestive) heart failure: Secondary | ICD-10-CM | POA: Diagnosis not present

## 2019-09-13 DIAGNOSIS — I10 Essential (primary) hypertension: Secondary | ICD-10-CM | POA: Diagnosis not present

## 2019-09-13 DIAGNOSIS — R0609 Other forms of dyspnea: Secondary | ICD-10-CM

## 2019-09-17 NOTE — Progress Notes (Signed)
Pt aware of results and instructions. He will do labs at pcp office. Do we need to still put in an order and fax to pcp or??

## 2019-11-11 ENCOUNTER — Telehealth (HOSPITAL_COMMUNITY): Payer: Self-pay

## 2019-11-11 NOTE — Telephone Encounter (Signed)
Pt returned call regarding monoclonal antibody infusion.  Stated his symptoms began 10-1-2. Symptoms are sore throat, fatigue.  Rapid test today was +.  States hx of cva and chf.  APP to call pt regarding scheduling appt.  Pt can call hotline for further question.  [phone].

## 2019-11-11 NOTE — Telephone Encounter (Signed)
Left message for pt regarding monoclonal antibody therapy for covid 19.  If pt interested please call hotline  [phone].

## 2019-11-12 ENCOUNTER — Other Ambulatory Visit: Payer: Self-pay | Admitting: Physician Assistant

## 2019-11-12 ENCOUNTER — Other Ambulatory Visit (HOSPITAL_COMMUNITY): Payer: Self-pay

## 2019-11-12 DIAGNOSIS — I5032 Chronic diastolic (congestive) heart failure: Secondary | ICD-10-CM

## 2019-11-12 DIAGNOSIS — U071 COVID-19: Secondary | ICD-10-CM

## 2019-11-12 DIAGNOSIS — Z8673 Personal history of transient ischemic attack (TIA), and cerebral infarction without residual deficits: Secondary | ICD-10-CM

## 2019-11-12 DIAGNOSIS — I10 Essential (primary) hypertension: Secondary | ICD-10-CM

## 2019-11-12 NOTE — Progress Notes (Signed)
I connected by phone with Francisco Gross on 11/12/2019 at 10:27 AM to discuss the potential use of a new treatment for mild to moderate COVID-19 viral infection in non-hospitalized patients.  This patient is a 59 y.o. male that meets the FDA criteria for Emergency Use Authorization of COVID monoclonal antibody casirivimab/imdevimab or bamlanivimab/eteseviamb.  Has a (+) direct SARS-CoV-2 viral test result  Has mild or moderate COVID-19   Is NOT hospitalized due to COVID-19  Is within 10 days of symptom onset  Has at least one of the high risk factor(s) for progression to severe COVID-19 and/or hospitalization as defined in EUA.  Specific high risk criteria : Cardiovascular disease or hypertension and Neurodevelopmental disorder   I have spoken and communicated the following to the patient or parent/caregiver regarding COVID monoclonal antibody treatment:  1. FDA has authorized the emergency use for the treatment of mild to moderate COVID-19 in adults and pediatric patients with positive results of direct SARS-CoV-2 viral testing who are 33 years of age and older weighing at least 40 kg, and who are at high risk for progressing to severe COVID-19 and/or hospitalization.  2. The significant known and potential risks and benefits of COVID monoclonal antibody, and the extent to which such potential risks and benefits are unknown.  3. Information on available alternative treatments and the risks and benefits of those alternatives, including clinical trials.  4. Patients treated with COVID monoclonal antibody should continue to self-isolate and use infection control measures (e.g., wear mask, isolate, social distance, avoid sharing personal items, clean and disinfect "high touch" surfaces, and frequent handwashing) according to CDC guidelines.   5. The patient or parent/caregiver has the option to accept or refuse COVID monoclonal antibody treatment.  After reviewing this information with the  patient, the patient has agreed to receive one of the available covid 19 monoclonal antibodies and will be provided an appropriate fact sheet prior to infusion.   [place], [place] 11/12/2019 10:27 AM

## 2019-11-13 ENCOUNTER — Ambulatory Visit (HOSPITAL_COMMUNITY)
Admission: RE | Admit: 2019-11-13 | Discharge: 2019-11-13 | Disposition: A | Discharge status: Home / Self Care | Payer: BC Managed Care – PPO | Source: Ambulatory Visit | Attending: Pulmonary Disease | Admitting: Pulmonary Disease

## 2019-11-13 DIAGNOSIS — U071 COVID-19: Secondary | ICD-10-CM | POA: Insufficient documentation

## 2019-11-13 MED ORDER — ALBUTEROL SULFATE HFA 108 (90 BASE) MCG/ACT IN AERS: 2.0000 | INHALATION_SPRAY | Freq: Once | RESPIRATORY_TRACT | Status: DC | PRN

## 2019-11-13 MED ORDER — FAMOTIDINE IN NACL 20-0.9 MG/50ML-% IV SOLN: 20.0000 mg | Freq: Once | INTRAVENOUS | Status: DC | PRN

## 2019-11-13 MED ORDER — SODIUM CHLORIDE 0.9 % IV SOLN: INTRAVENOUS | Status: DC | PRN

## 2019-11-13 MED ORDER — SODIUM CHLORIDE 0.9 % IV SOLN
1200.0000 mg | Freq: Once | INTRAVENOUS | Status: AC
  Administered 2019-11-13: 1200 mg via INTRAVENOUS

## 2019-11-13 MED ORDER — EPINEPHRINE 0.3 MG/0.3ML IJ SOAJ: 0.3000 mg | Freq: Once | INTRAMUSCULAR | Status: DC | PRN

## 2019-11-13 MED ORDER — DIPHENHYDRAMINE HCL 50 MG/ML IJ SOLN: 50.0000 mg | Freq: Once | INTRAMUSCULAR | Status: DC | PRN

## 2019-11-13 MED ORDER — METHYLPREDNISOLONE SODIUM SUCC 125 MG IJ SOLR: 125.0000 mg | Freq: Once | INTRAMUSCULAR | Status: DC | PRN

## 2019-11-13 NOTE — Progress Notes (Signed)
  Diagnosis: COVID-19  Physician: Dr Wright  Procedure: [facility] Infusion Clinic Med: casirivimab\imdevimab infusion - Provided patient with casirivimab\imdevimab fact sheet for patients, parents and caregivers prior to infusion.  Complications: No immediate complications noted.  Discharge: Discharged home   Marc Leichter Rudd 11/13/2019  

## 2019-11-13 NOTE — Discharge Instructions (Signed)

## 2019-12-26 NOTE — Progress Notes (Deleted)
NEUROLOGY FOLLOW UP OFFICE NOTE  Francisco Gross [id number]   Subjective:  Francisco Gross is a 59 year old right-handed white male with hypertension, hyperlipidemia, tobacco abuse and history of kidney stones who follows up for stroke secondary to left ICA occlusion.  UPDATE: Current medications:  ASA 81mg , atorvastatin 80mg , amlodipine, losartan, Toprol-XL, Lasix  CTA of neck from 04/05/2019 personally reviewed showed left cervical left ICA occlusion with at least partial reconstitution intracranially at the level of the proximal cavernous segment, as well as plaque at the right ICA origin with 50% stenosis.  ***.  Last month, he had COVID-19 and ***  HISTORY: On 01/18/2019, he developed sudden onset difficulty forming words that lasted about 10 to 15 minutes.  After speech difficulty resolved, he noticed that he felt general fatigue.  There was no actual weakness.  He has baseline numbness in his fingers and feet, which were unchanged.  Due to persistent symptoms, he presented to [facility] the next day.  He did not receive IV tPA as he was outside the therapeutic window.  CT head in the ED showed small hypodensities in the left frontal lobe and another in the left parietal lobe concerning for infarcts.  Follow up MRI of brain confirmed acute watershed infarcts involving the right frontal and parietal lobes.  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  He had a TEE that did not reveal any cardiac source of emboli.  No atrial fibrillation on telemetry.  Lipid panel showed total cholesterol of 242, TG 243, HDL 32 and LDL 161.4; Hgb 14/12/2018 was 5.5; TSH was 1.11.  He was evaluated and treated by PT/OT/speech therapy.  He was discharged on ASA and Plavix and atorvastatin 80mg  and restarted on his antihypertensive medications.  He has not smoked a cigarette since the stroke.  Following discharge, he still had  intermittent right leg instability and hand numbness.  He also reports intermittent dimming of vision in his left eye.  PAST MEDICAL HISTORY: Past Medical History:  Diagnosis Date  . Chronic kidney disease    kidney stones   . Hyperlipidemia   . Hypertension   . Stroke [facility])     MEDICATIONS: Current Outpatient Medications on File Prior to Visit  Medication Sig Dispense Refill  . aspirin EC 81 MG tablet Take 81 mg by mouth daily.    . furosemide (LASIX) 20 MG tablet Take 1 tablet (20 mg total) by mouth daily. 30 tablet 3  . losartan (COZAAR) 100 MG tablet Take 100 mg by mouth daily.    . rosuvastatin (CRESTOR) 40 MG tablet Take 40 mg by mouth daily.    D4Y spironolactone (ALDACTONE) 50 MG tablet Take 1 tablet (50 mg total) by mouth daily. 60 tablet 2   No current facility-administered medications on file prior to visit.    ALLERGIES: No Known Allergies  FAMILY HISTORY: Family History  Problem Relation Age of Onset  . Breast cancer Mother   . Lung cancer Father   . Breast cancer Sister   . Colon cancer Neg Hx   . Rectal cancer Neg Hx   . Stomach cancer Neg Hx    SOCIAL HISTORY: Social History   Socioeconomic History  . Marital status: Married    Spouse name: Not on file  . Number of children: 2  . Years of education: Not on file  . Highest education level: Not on file  Occupational History  . Not on file  Tobacco Use  . Smoking status: Former Smoker    Packs/day: 1.50    Years: 40.00    Pack years: 60.00    Types: Cigarettes    Quit date: 01/18/2019    Years since quitting: 0.9  . Smokeless tobacco: Never Used  Vaping Use  . Vaping Use: Never used  Substance and Sexual Activity  . Alcohol use: Not Currently    Alcohol/week: 0.0 standard drinks  . Drug use: No  . Sexual activity: Not on file  Other Topics Concern  . Not on file  Social History Narrative   Right handed   One story home   Drinks one cup of coffe daily   Social Determinants of Health    Financial Resource Strain:   . Difficulty of Paying Living Expenses: Not on file  Food Insecurity:   . Worried About Programme researcher, broadcasting/film/video in the Last Year: Not on file  . Ran Out of Food in the Last Year: Not on file  Transportation Needs:   . Lack of Transportation (Medical): Not on file  . Lack of Transportation (Non-Medical): Not on file  Physical Activity:   . Days of Exercise per Week: Not on file  . Minutes of Exercise per Session: Not on file  Stress:   . Feeling of Stress : Not on file  Social Connections:   . Frequency of Communication with Friends and Family: Not on file  . Frequency of Social Gatherings with Friends and Family: Not on file  . Attends Religious Services: Not on file  . Active Member of Clubs or Organizations: Not on file  . Attends Banker Meetings: Not on file  . Marital Status: Not on file  Intimate Partner Violence:   . Fear of Current or Ex-Partner: Not on file  . Emotionally Abused: Not on file  . Physically Abused: Not on file  . Sexually Abused: Not on file     Objective:  *** General: No acute distress.  Patient appears well-groomed.   Head:  Normocephalic/atraumatic Eyes:  Fundi examined but not visualized Neck: supple, no paraspinal tenderness, full range of motion Heart:  Regular rate and rhythm Lungs:  Clear to auscultation bilaterally Back: No paraspinal tenderness Neurological Exam: alert and oriented to person, place, and time. Attention span and concentration intact, recent and remote memory intact, fund of knowledge intact.  Speech fluent and not dysarthric, language intact.  CN II-XII intact. Bulk and tone normal, muscle strength 5/5 throughout.  Sensation to light touch, temperature and vibration intact.  Deep tendon reflexes 2+ throughout, toes downgoing.  Finger to nose and heel to shin testing intact.  Gait normal, Romberg negative.   Assessment/Plan:   1.  Left hemispheric watershed infarcts secondary to left  ICA occlusion 2.  Right carotid artery stenosis 3.  Hypertension 4.  Hyperlipidemia 5.  Tobacco use disorder  1.  Secondary stroke prevention as managed by PCP: -  ASA 81mg  daily -  Atorvastatin 80mg  daily.  LDL goal less than 70 -  Blood pressure control.  Recommend that systolic blood pressure goal remain a little elevated (130-150) to ensure adequate perfusion of the collateral vessels distal to the occlusion. 2.  Annual carotid duplex 3.  Mediterranean diet 4.  Routine exercise  5.  ***  , DO  CC: , MD

## 2019-12-27 ENCOUNTER — Ambulatory Visit: Payer: BC Managed Care – PPO | Admitting: Neurology

## 2019-12-27 ENCOUNTER — Encounter: Payer: Self-pay | Admitting: Neurology

## 2019-12-27 DIAGNOSIS — Z029 Encounter for administrative examinations, unspecified: Secondary | ICD-10-CM

## 2020-03-16 ENCOUNTER — Ambulatory Visit: Payer: BC Managed Care – PPO | Admitting: Cardiology

## 2020-03-16 ENCOUNTER — Encounter: Payer: Self-pay | Admitting: Cardiology

## 2020-03-16 ENCOUNTER — Other Ambulatory Visit: Payer: Self-pay

## 2020-03-16 VITALS — BP 180/89 | HR 80 | Temp 98.2°F | Resp 16 | Ht 68.0 in | Wt 276.0 lb

## 2020-03-16 DIAGNOSIS — I5032 Chronic diastolic (congestive) heart failure: Secondary | ICD-10-CM | POA: Diagnosis not present

## 2020-03-16 DIAGNOSIS — R0600 Dyspnea, unspecified: Secondary | ICD-10-CM

## 2020-03-16 DIAGNOSIS — I10 Essential (primary) hypertension: Secondary | ICD-10-CM | POA: Diagnosis not present

## 2020-03-16 DIAGNOSIS — Z8673 Personal history of transient ischemic attack (TIA), and cerebral infarction without residual deficits: Secondary | ICD-10-CM | POA: Diagnosis not present

## 2020-03-16 DIAGNOSIS — R0609 Other forms of dyspnea: Secondary | ICD-10-CM | POA: Diagnosis not present

## 2020-03-16 MED ORDER — AMLODIPINE BESYLATE 5 MG PO TABS: 5.0000 mg | ORAL_TABLET | Freq: Every day | ORAL | 3 refills | Status: DC

## 2020-03-16 MED ORDER — LOSARTAN POTASSIUM-HCTZ 100-25 MG PO TABS: 1.0000 | ORAL_TABLET | Freq: Every day | ORAL | 2 refills | Status: DC

## 2020-03-16 NOTE — Progress Notes (Signed)
Patient referred by Leanna Battles, MD for cardiac risk stratification  Subjective:   Francisco Gross, male    DOB: 1960/04/14, 60 y.o.   MRN: [id number]   Chief Complaint  Patient presents with  . Shortness of Breath  . Congestive Heart Failure  . Follow-up    6 month    HPI  60 y.o. male with hypertension, former smoker, stroke (01/2019), exertional dyspnea  Patient denies chest pain, palpitations, leg edema, orthopnea, PND, TIA/syncope, but has experienced exertional dyspnea. Blood pressure is elevated today. He has nt checked at home in a while. He ran out of spironolactone and lasix some time back, so has stopped taking them.    Current Outpatient Medications on File Prior to Visit  Medication Sig Dispense Refill  . aspirin EC 81 MG tablet Take 81 mg by mouth daily.    Marland Kitchen losartan (COZAAR) 100 MG tablet Take 100 mg by mouth daily.    . rosuvastatin (CRESTOR) 40 MG tablet Take 40 mg by mouth daily.    . tamsulosin (FLOMAX) 0.4 MG CAPS capsule Take 0.4 mg by mouth daily.    . furosemide (LASIX) 20 MG tablet Take 1 tablet (20 mg total) by mouth daily. (Patient not taking: Reported on 03/16/2020) 30 tablet 3   No current facility-administered medications on file prior to visit.    Cardiovascular and other pertinent studies:  EKG 03/16/2020: Sinus rhythm 94 bpm  Left atrial enlargement.  Possible left ventricular hypertrophy with secondary ST-T changes  Echocardiogram 07/18/2019:  1. Normal LV systolic function with visual EF 55-60%. Left ventricle  cavity is normal in size. Moderate hypertrophy of the  left ventricle. Normal global wall motion. Indeterminate diastolic filling  pattern, elevated LAP.  2. Mild (Grade I) mitral regurgitation.  3. Mild tricuspid regurgitation.  4. No prior study for comparison.  CT Angio Neck 04/05/2019: Occlusion of the cervical left ICA. At least partial reconstitution intracranially at the level of the proximal cavernous  segment. Remainder of intracranial portion is not included. Plaque at the right ICA origin with approximately 50% stenosis. Nonspecific left maxillary sinus air-fluid level, which can reflect acute sinusitis in the appropriate clinical setting.   ABI 02/19/2019:  This exam reveals normal perfusion of the right lower extremity (ABI 1.00). This exam reveals normal perfusion of the left lower extremity (ABI 1.00).    Lexiscan Sestamibi Stress Test 02/18/2019: Nondiagnostic ECG stress due to pharmacologic stress. oft tissue attenuation in the anterior wall. Normal myocardial perfusion. without ischemia or scar All segments of left ventricle demonstrated normal wall motion and thickening. Stress LV EF is normal 57%.  No previous exam available for comparison. Low risk study.   CT head 01/2019: Possible left frontal lobe acute infarct   Recent labs: 07/18/2019: BNP 9 normal  01/21/2019: Glucose 107, BUN/Cr 21/1.1. eGFR >60. Na/K 139/4.4. Rest of the CMP normal. H/H 15.7/46.8. Platelets 162    Review of Systems  Cardiovascular: Positive for dyspnea on exertion (Improved). Negative for chest pain, leg swelling, palpitations and syncope.  Musculoskeletal: Positive for back pain.         Vitals:   03/16/20 0833  BP: (!) 196/76  Pulse: (!) 101  Resp: 16  Temp: 98.2 F (36.8 C)  SpO2: 98%     Body mass index is 41.97 kg/m. Filed Weights   03/16/20 0833  Weight: 276 lb (125.2 kg)     Objective:   Physical Exam Vitals and nursing note reviewed.  Constitutional:  Appearance: He is well-developed.  Neck:     Vascular: No JVD.  Cardiovascular:     Rate and Rhythm: Normal rate and regular rhythm.     Pulses:          Carotid pulses are on the right side with bruit and on the left side with bruit.      Dorsalis pedis pulses are 1+ on the right side and 1+ on the left side.       Posterior tibial pulses are 0 on the right side and 0 on the left side.     Heart  sounds: Normal heart sounds. No murmur heard.   Pulmonary:     Effort: Pulmonary effort is normal.     Breath sounds: Normal breath sounds. No wheezing or rales.         Assessment & Recommendations:   60 y.o. male with hypertension, former smoker, stroke (01/2019), exertional dyspnea  Exertional dyspnea: COPD, uncontrolled hypertension. See below.  Hypertension: Uncontrolled. Switch losartan 100 mg to losartan-HCTZ 100-25 mg Added amlodipine 5 mg daily Labs in 2 weeks  H/o stroke: On Aspirin 81, lipitor 80. Continue f/u w/neurology. Check b/l carotid US  F/u in 4 weeks  [place], MD [facility] [place] Pager: [phone] Office: [phone]

## 2020-03-23 ENCOUNTER — Ambulatory Visit: Payer: Self-pay

## 2020-03-23 ENCOUNTER — Ambulatory Visit: Payer: BC Managed Care – PPO

## 2020-03-23 ENCOUNTER — Other Ambulatory Visit: Payer: Self-pay

## 2020-03-23 DIAGNOSIS — I6523 Occlusion and stenosis of bilateral carotid arteries: Secondary | ICD-10-CM

## 2020-03-23 DIAGNOSIS — Z8673 Personal history of transient ischemic attack (TIA), and cerebral infarction without residual deficits: Secondary | ICD-10-CM

## 2020-03-23 DIAGNOSIS — Z789 Other specified health status: Secondary | ICD-10-CM

## 2020-03-24 ENCOUNTER — Other Ambulatory Visit: Payer: Self-pay | Admitting: Cardiology

## 2020-03-24 DIAGNOSIS — Z789 Other specified health status: Secondary | ICD-10-CM

## 2020-04-02 ENCOUNTER — Other Ambulatory Visit: Payer: Self-pay

## 2020-04-02 ENCOUNTER — Encounter: Payer: Self-pay | Admitting: Cardiology

## 2020-04-02 ENCOUNTER — Ambulatory Visit: Payer: BC Managed Care – PPO | Admitting: Cardiology

## 2020-04-02 DIAGNOSIS — I10 Essential (primary) hypertension: Secondary | ICD-10-CM

## 2020-04-02 MED ORDER — AMLODIPINE BESYLATE 5 MG PO TABS: 5.0000 mg | ORAL_TABLET | Freq: Every evening | ORAL | 3 refills | Status: DC

## 2020-04-02 MED ORDER — ASPIRIN EC 81 MG PO TBEC: 81.0000 mg | DELAYED_RELEASE_TABLET | Freq: Every day | ORAL | 3 refills | Status: AC

## 2020-04-02 MED ORDER — ROSUVASTATIN CALCIUM 40 MG PO TABS: 40.0000 mg | ORAL_TABLET | Freq: Every day | ORAL | 3 refills | Status: DC

## 2020-04-02 MED ORDER — LOSARTAN POTASSIUM-HCTZ 100-25 MG PO TABS: 1.0000 | ORAL_TABLET | Freq: Every morning | ORAL | 3 refills | Status: DC

## 2020-04-02 NOTE — Progress Notes (Signed)
Patient referred by Francisco Matin, MD for cardiac risk stratification  Subjective:   Francisco Gross, male    DOB: 07/27/1960, 60 y.o.   MRN: [id number]   Chief Complaint  Patient presents with  . Hypertension  . Follow-up    4 week    Hypertension Pertinent negatives include no chest pain or palpitations.    60 y.o. male with hypertension, former smoker, stroke (01/2019), exertional dyspnea  Blood pressure improved compared to last visit. He has unchanged mild exertional dyspnea, but denies chest pain. Leg edema has resolved.   Current Outpatient Medications on File Prior to Visit  Medication Sig Dispense Refill  . amLODipine (NORVASC) 5 MG tablet Take 1 tablet (5 mg total) by mouth daily. 30 tablet 3  . aspirin EC 81 MG tablet Take 81 mg by mouth daily.    Marland Kitchen losartan-hydrochlorothiazide (HYZAAR) 100-25 MG tablet Take 1 tablet by mouth daily. 30 tablet 2  . rosuvastatin (CRESTOR) 40 MG tablet Take 40 mg by mouth daily.    . tamsulosin (FLOMAX) 0.4 MG CAPS capsule Take 0.4 mg by mouth daily.     No current facility-administered medications on file prior to visit.    Cardiovascular and other pertinent studies:  Echocardiogram 03/23/2020:  Left ventricle cavity is normal in size. Moderate concentric hypertrophy  of the left ventricle. Normal global wall motion. Normal LV systolic  function with visual EF 55-60%. Doppler evidence of grade I (impaired)  diastolic dysfunction, normal LAP.  Trileaflet aortic valve with aortic valve sclerosis without stenosis.  Mild (Grade I) mitral regurgitation.  No evidence of pulmonary hypertension.  EKG 03/16/2020: Sinus rhythm 94 bpm  Left atrial enlargement.  Possible left ventricular hypertrophy with secondary ST-T changes  CT Angio Neck 04/05/2019: Occlusion of the cervical left ICA. At least partial reconstitution intracranially at the level of the proximal cavernous segment. Remainder of intracranial portion is not  included. Plaque at the right ICA origin with approximately 50% stenosis. Nonspecific left maxillary sinus air-fluid level, which can reflect acute sinusitis in the appropriate clinical setting.   ABI 02/19/2019:  This exam reveals normal perfusion of the right lower extremity (ABI 1.00). This exam reveals normal perfusion of the left lower extremity (ABI 1.00).    Lexiscan Sestamibi Stress Test 02/18/2019: Nondiagnostic ECG stress due to pharmacologic stress. oft tissue attenuation in the anterior wall. Normal myocardial perfusion. without ischemia or scar All segments of left ventricle demonstrated normal wall motion and thickening. Stress LV EF is normal 57%.  No previous exam available for comparison. Low risk study.   CT head 01/2019: Possible left frontal lobe acute infarct   Recent labs: 2/7/20216: Glucose 106, BUN/Cr 27/1.24. EGFR 64. Na/K 142/4.6. Rest of the CMP normal Chol 153, TG 121, HDL 41, LDL 90  07/18/2019: BNP 9 normal  01/21/2019: Glucose 107, BUN/Cr 21/1.1. eGFR >60. Na/K 139/4.4. Rest of the CMP normal. H/H 15.7/46.8. Platelets 162    Review of Systems  Cardiovascular: Positive for dyspnea on exertion (Improved). Negative for chest pain, leg swelling, palpitations and syncope.  Musculoskeletal: Positive for back pain.         Vitals:   04/02/20 0950  BP: (!) 157/86  Pulse: 80  Resp: 16  Temp: 97.8 F (36.6 C)  SpO2: 95%     Body mass index is 41.66 kg/m. Filed Weights   04/02/20 0950  Weight: 274 lb (124.3 kg)     Objective:   Physical Exam Vitals and nursing note reviewed.  Constitutional:      Appearance: He is well-developed.  Neck:     Vascular: No JVD.  Cardiovascular:     Rate and Rhythm: Normal rate and regular rhythm.     Pulses:          Carotid pulses are on the right side with bruit and on the left side with bruit.      Dorsalis pedis pulses are 1+ on the right side and 1+ on the left side.       Posterior tibial  pulses are 0 on the right side and 0 on the left side.     Heart sounds: Normal heart sounds. No murmur heard.   Pulmonary:     Effort: Pulmonary effort is normal.     Breath sounds: Normal breath sounds. No wheezing or rales.         Assessment & Recommendations:   60 y.o. male with hypertension, former smoker, stroke (01/2019), exertional dyspnea  Exertional dyspnea: COPD, uncontrolled hypertension. See below.  Hypertension: Improving. Comtinue losartan-HCTZ 100-25 mg, amlodipine 5 mg daily Keep BP log  H/o stroke: On Aspirin 81, lipitor 80. Continue f/u w/neurology. Check b/l carotid US  F/u in 3 months  Francisco Gross Francisco Dyer, MD [facility] [place] Pager: [phone] Office: [phone]

## 2020-04-03 ENCOUNTER — Encounter: Payer: Self-pay | Admitting: Cardiology

## 2020-04-09 ENCOUNTER — Ambulatory Visit: Payer: BC Managed Care – PPO

## 2020-04-09 ENCOUNTER — Other Ambulatory Visit: Payer: Self-pay

## 2020-04-09 DIAGNOSIS — I6523 Occlusion and stenosis of bilateral carotid arteries: Secondary | ICD-10-CM | POA: Diagnosis not present

## 2020-04-09 DIAGNOSIS — Z8673 Personal history of transient ischemic attack (TIA), and cerebral infarction without residual deficits: Secondary | ICD-10-CM | POA: Diagnosis not present

## 2020-04-13 NOTE — Progress Notes (Signed)
Called and spoke with patient regarding his CAD results.

## 2020-04-27 ENCOUNTER — Ambulatory Visit: Payer: BC Managed Care – PPO | Admitting: Cardiology

## 2020-06-29 ENCOUNTER — Encounter: Payer: Self-pay | Admitting: Cardiology

## 2020-06-29 ENCOUNTER — Other Ambulatory Visit: Payer: Self-pay

## 2020-06-29 ENCOUNTER — Ambulatory Visit: Payer: BC Managed Care – PPO | Admitting: Cardiology

## 2020-06-29 VITALS — BP 151/78 | HR 95 | Temp 98.7°F | Resp 16 | Ht 68.0 in | Wt 283.0 lb

## 2020-06-29 DIAGNOSIS — E782 Mixed hyperlipidemia: Secondary | ICD-10-CM

## 2020-06-29 DIAGNOSIS — I5032 Chronic diastolic (congestive) heart failure: Secondary | ICD-10-CM | POA: Diagnosis not present

## 2020-06-29 DIAGNOSIS — I10 Essential (primary) hypertension: Secondary | ICD-10-CM | POA: Diagnosis not present

## 2020-06-29 DIAGNOSIS — Z8673 Personal history of transient ischemic attack (TIA), and cerebral infarction without residual deficits: Secondary | ICD-10-CM | POA: Diagnosis not present

## 2020-06-29 MED ORDER — EZETIMIBE 10 MG PO TABS: 10.0000 mg | ORAL_TABLET | Freq: Every day | ORAL | 3 refills | Status: DC

## 2020-06-29 NOTE — Progress Notes (Signed)
Patient referred by Geannie Risen, MD for cardiac risk stratification  Subjective:   Francisco Gross, male    DOB: 15-Mar-1960, 60 y.o.   MRN: [id number]   Chief Complaint  Patient presents with  . Hypertension  . Congestive Heart Failure  . Follow-up    3 month    60 y.o. male with hypertension, former smoker, stroke (01/2019), exertional dyspnea  Blood pressure improved compared to last visit. He has unchanged mild exertional dyspnea, but denies chest pain. Leg edema is mild and stable.  Reviewed carotid duplex results with the patient, details below.  Current Outpatient Medications on File Prior to Visit  Medication Sig Dispense Refill  . amLODipine (NORVASC) 5 MG tablet Take 1 tablet (5 mg total) by mouth every evening. 90 tablet 3  . aspirin EC 81 MG tablet Take 1 tablet (81 mg total) by mouth daily. 90 tablet 3  . losartan-hydrochlorothiazide (HYZAAR) 100-25 MG tablet Take 1 tablet by mouth in the morning. 90 tablet 3  . rosuvastatin (CRESTOR) 40 MG tablet Take 1 tablet (40 mg total) by mouth daily. 90 tablet 3  . tamsulosin (FLOMAX) 0.4 MG CAPS capsule Take 0.4 mg by mouth daily.     No current facility-administered medications on file prior to visit.    Cardiovascular and other pertinent studies:  Echocardiogram 03/23/2020:  Left ventricle cavity is normal in size. Moderate concentric hypertrophy  of the left ventricle. Normal global wall motion. Normal LV systolic  function with visual EF 55-60%. Doppler evidence of grade I (impaired)  diastolic dysfunction, normal LAP.  Trileaflet aortic valve with aortic valve sclerosis without stenosis.  Mild (Grade I) mitral regurgitation.  No evidence of pulmonary hypertension.  EKG 03/16/2020: Sinus rhythm 94 bpm  Left atrial enlargement.  Possible left ventricular hypertrophy with secondary ST-T changes  CT Angio Neck 04/05/2019: Occlusion of the cervical left ICA. At least partial reconstitution intracranially at the  level of the proximal cavernous segment. Remainder of intracranial portion is not included. Plaque at the right ICA origin with approximately 50% stenosis. Nonspecific left maxillary sinus air-fluid level, which can reflect acute sinusitis in the appropriate clinical setting.   ABI 02/19/2019:  This exam reveals normal perfusion of the right lower extremity (ABI 1.00). This exam reveals normal perfusion of the left lower extremity (ABI 1.00).    Lexiscan Sestamibi Stress Test 02/18/2019: Nondiagnostic ECG stress due to pharmacologic stress. oft tissue attenuation in the anterior wall. Normal myocardial perfusion. without ischemia or scar All segments of left ventricle demonstrated normal wall motion and thickening. Stress LV EF is normal 57%.  No previous exam available for comparison. Low risk study.   CT head 01/2019: Possible left frontal lobe acute infarct   Recent labs: 03/17/2019: Glucose 106, BUN/Cr 27/1.24. EGFR 64. Na/K 142/4.6. Rest of the CMP normal Chol 153, TG 121, HDL 41, LDL 90  07/18/2019: BNP 9 normal  01/21/2019: Glucose 107, BUN/Cr 21/1.1. eGFR >60. Na/K 139/4.4. Rest of the CMP normal. H/H 15.7/46.8. Platelets 162    Review of Systems  Cardiovascular: Positive for dyspnea on exertion (Improved). Negative for chest pain, leg swelling, palpitations and syncope.  Musculoskeletal: Positive for back pain.         Vitals:   06/29/20 0847  BP: (!) 151/78  Pulse: 95  Resp: 16  Temp: 98.7 F (37.1 C)  SpO2: 96%     Body mass index is 43.03 kg/m. Filed Weights   06/29/20 0847  Weight: 283 lb (128.4 kg)  Objective:   Physical Exam Vitals and nursing note reviewed.  Constitutional:      Appearance: He is well-developed.  Neck:     Vascular: No JVD.  Cardiovascular:     Rate and Rhythm: Normal rate and regular rhythm.     Pulses:          Carotid pulses are on the right side with bruit and on the left side with bruit.      Dorsalis pedis  pulses are 1+ on the right side and 1+ on the left side.       Posterior tibial pulses are 0 on the right side and 0 on the left side.     Heart sounds: Normal heart sounds. No murmur heard.   Pulmonary:     Effort: Pulmonary effort is normal.     Breath sounds: Normal breath sounds. No wheezing or rales.         Assessment & Recommendations:   60 y.o. male with hypertension, former smoker, stroke (01/2019), exertional dyspnea  Exertional dyspnea: Multifactorial due to COPD, uncontrolled hypertension, obesity.  Hypertension: Improving.  Home blood pressure readings usually stay between 1 20-40/70-80 mmHg.   Comtinue losartan-HCTZ 100-25 mg, amlodipine 5 mg daily  H/o stroke: On Aspirin 81, lipitor 80. Known occluded left carotid.   Moderate right carotid stenosis.   Continue risk factor modification.   Continue aspirin 81 mg daily, Crestor 40 mg daily.   LDL remains elevated at 90.  Added Zetia 10 mg daily.    Lipid panel in 6 months.    F/u in 6 months  Salik Grewell Esther Hardy, MD [facility] Pager: [phone] Office: [phone]

## 2020-09-21 DIAGNOSIS — I6930 Unspecified sequelae of cerebral infarction: Secondary | ICD-10-CM | POA: Diagnosis not present

## 2020-09-21 DIAGNOSIS — R202 Paresthesia of skin: Secondary | ICD-10-CM | POA: Diagnosis not present

## 2020-09-21 DIAGNOSIS — I10 Essential (primary) hypertension: Secondary | ICD-10-CM | POA: Diagnosis not present

## 2020-12-29 NOTE — Progress Notes (Signed)
Patient referred by Geannie Risen, MD for cardiac risk stratification  Subjective:   Francisco Gross, male    DOB: [birth year], 60 y.o.   MRN: [id number]   Chief Complaint  Patient presents with   Hypertension   Congestive Heart Failure   Follow-up    6 month    60 y.o. male with hypertension, former smoker, stroke (01/2019), exertional dyspnea  Patient denies any chest pain. He has unchanged exertional dyspnea. He has severe back pain, radiating to his thighs and calves, better with stretching and resting, worse with walking. Reviewed external labs today, details below.  Current Outpatient Medications on File Prior to Visit  Medication Sig Dispense Refill   amLODipine (NORVASC) 5 MG tablet Take 1 tablet (5 mg total) by mouth every evening. 90 tablet 3   aspirin EC 81 MG tablet Take 1 tablet (81 mg total) by mouth daily. 90 tablet 3   ezetimibe (ZETIA) 10 MG tablet Take 1 tablet (10 mg total) by mouth daily. 90 tablet 3   losartan-hydrochlorothiazide (HYZAAR) 100-25 MG tablet Take 1 tablet by mouth in the morning. 90 tablet 3   omega-3 acid ethyl esters (LOVAZA) 1 g capsule Take 2 capsules by mouth 2 (two) times daily.     rosuvastatin (CRESTOR) 40 MG tablet Take 1 tablet (40 mg total) by mouth daily. 90 tablet 3   No current facility-administered medications on file prior to visit.    Cardiovascular and other pertinent studies:  EKG 12/30/2020: Sinus rhythm 59 bpm Lateral T wave inversion, consider iscehmia  Echocardiogram 03/23/2020:  Left ventricle cavity is normal in size. Moderate concentric hypertrophy  of the left ventricle. Normal global wall motion. Normal LV systolic  function with visual EF 55-60%. Doppler evidence of grade I (impaired)  diastolic dysfunction, normal LAP.  Trileaflet aortic valve with aortic valve sclerosis without stenosis.  Mild (Grade I) mitral regurgitation.  No evidence of pulmonary hypertension.  CT Angio Neck 04/05/2019: Occlusion of  the cervical left ICA. At least partial reconstitution intracranially at the level of the proximal cavernous segment. Remainder of intracranial portion is not included. Plaque at the right ICA origin with approximately 50% stenosis. Nonspecific left maxillary sinus air-fluid level, which can reflect acute sinusitis in the appropriate clinical setting.   ABI 02/19/2019:  This exam reveals normal perfusion of the right lower extremity (ABI 1.00). This exam reveals normal perfusion of the left lower extremity (ABI 1.00).    Lexiscan Sestamibi Stress Test 02/18/2019: Nondiagnostic ECG stress due to pharmacologic stress. oft tissue attenuation in the anterior wall. Normal myocardial perfusion. without ischemia or scar All segments of left ventricle demonstrated normal wall motion and thickening. Stress LV EF is normal 57%.  No previous exam available for comparison. Low risk study.   CT head 01/2019: Possible left frontal lobe acute infarct   Recent labs: 12/15/2020: Glucose 99, BUN/Cr 27/1.49. EGFR 54. Na/K 144/5.3. Rest of the CMP normal H/H 15/46. MCV 92. Platelets 224 HbA1C 6.0% Chol 143, TG 99, HDL 40, LDL 84 TSH 1.1 normal  03/17/2019: Glucose 106, BUN/Cr 27/1.24. EGFR 64. Na/K 142/4.6. Rest of the CMP normal Chol 153, TG 121, HDL 41, LDL 90  07/18/2019: BNP 9 normal  01/21/2019: Glucose 107, BUN/Cr 21/1.1. eGFR >60. Na/K 139/4.4. Rest of the CMP normal. H/H 15.7/46.8. Platelets 162    Review of Systems  Cardiovascular:  Positive for dyspnea on exertion (Improved). Negative for chest pain, leg swelling, palpitations and syncope.  Musculoskeletal:  Positive for back  pain.        Vitals:   12/30/20 0846  BP: 140/72  Pulse: 66  Resp: 16  Temp: 97.7 F (36.5 C)  SpO2: 95%     Body mass index is 44.7 kg/m. Filed Weights   12/30/20 0846  Weight: 294 lb (133.4 kg)     Objective:   Physical Exam Vitals and nursing note reviewed.  Constitutional:       Appearance: He is well-developed.  Neck:     Vascular: No JVD.  Cardiovascular:     Rate and Rhythm: Normal rate and regular rhythm.     Pulses:          Carotid pulses are  on the right side with bruit and  on the left side with bruit.      Dorsalis pedis pulses are 1+ on the right side and 1+ on the left side.       Posterior tibial pulses are 0 on the right side and 0 on the left side.     Heart sounds: Normal heart sounds. No murmur heard. Pulmonary:     Effort: Pulmonary effort is normal.     Breath sounds: Normal breath sounds. No wheezing or rales.  Musculoskeletal:     Right lower leg: Edema (Trace) present.     Left lower leg: Edema (Trace) present.        Assessment & Recommendations:   60 y.o. male with hypertension, former smoker, stroke (01/2019), exertional dyspnea  Exertional dyspnea: Multifactorial due to COPD, uncontrolled hypertension, obesity.  Hypertension: Fairly well controlled  H/o stroke: On Aspirin 81, lipitor 80. Known occluded left carotid.   Moderate right carotid stenosis.   Continue risk factor modification.   Continue aspirin 81 mg daily, Crestor 40 mg daily, Zetia 10 mg daily.   Lipids fairly well controlled  Leg pain: Likely neurologic claudication.  F/u in 1 year  Nigel Mormon, MD [facility] Pager: [phone] Office: [phone]

## 2020-12-30 ENCOUNTER — Ambulatory Visit: Payer: BC Managed Care – PPO | Admitting: Cardiology

## 2020-12-30 ENCOUNTER — Other Ambulatory Visit: Payer: Self-pay

## 2020-12-30 ENCOUNTER — Encounter: Payer: Self-pay | Admitting: Cardiology

## 2020-12-30 VITALS — BP 140/72 | HR 66 | Temp 97.7°F | Resp 16 | Ht 68.0 in | Wt 294.0 lb

## 2020-12-30 DIAGNOSIS — I10 Essential (primary) hypertension: Secondary | ICD-10-CM

## 2020-12-30 DIAGNOSIS — Z8673 Personal history of transient ischemic attack (TIA), and cerebral infarction without residual deficits: Secondary | ICD-10-CM

## 2020-12-30 DIAGNOSIS — E782 Mixed hyperlipidemia: Secondary | ICD-10-CM | POA: Diagnosis not present

## 2020-12-30 DIAGNOSIS — I5032 Chronic diastolic (congestive) heart failure: Secondary | ICD-10-CM

## 2021-01-10 IMAGING — CT CT ANGIO NECK
2 of 3 series · 9 of 32 positions shown, 12 images · IV contrast (APPLIED)
Comparison: None.

CLINICAL DATA: Left ICA occlusion with intermittent TIA symptoms

EXAM:
CT ANGIOGRAPHY NECK
TECHNIQUE: Multidetector CT imaging of the neck was performed using the
standard protocol during bolus administration of intravenous
contrast. Multiplanar CT image reconstructions and MIPs were
obtained to evaluate the vascular anatomy. Carotid stenosis
measurements (when applicable) are obtained utilizing NASCET
criteria, using the distal internal carotid diameter as the
denominator.
CONTRAST:  75mL [id number] IOPAMIDOL ([id number]) INJECTION 76%

[Series 5: carotid angio · axial · 0.45mm/px · z∈[-282,-90]mm · 7 of 130 slices shown]
[im 17/130  soft-tissue]
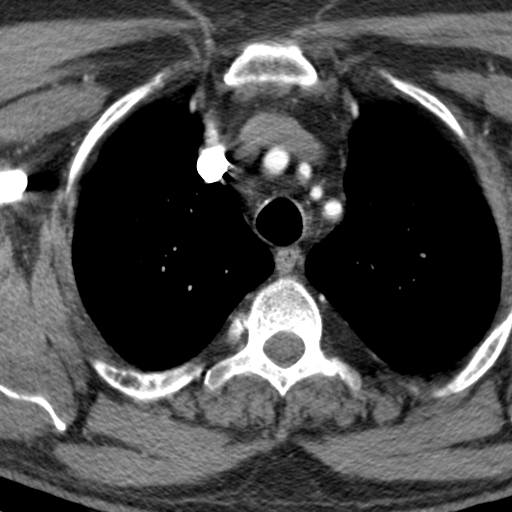
[im 33/130  soft-tissue]
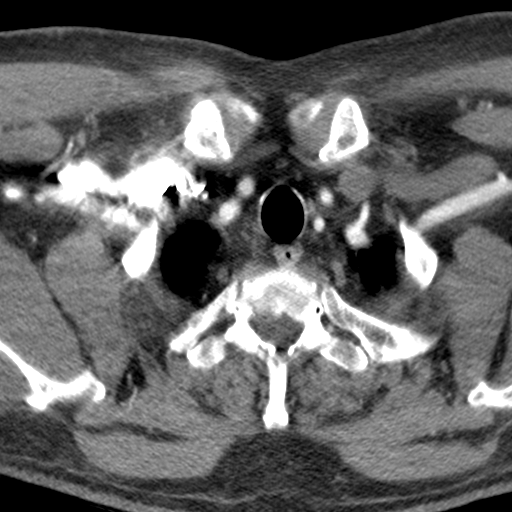
[im 49/130  soft-tissue]
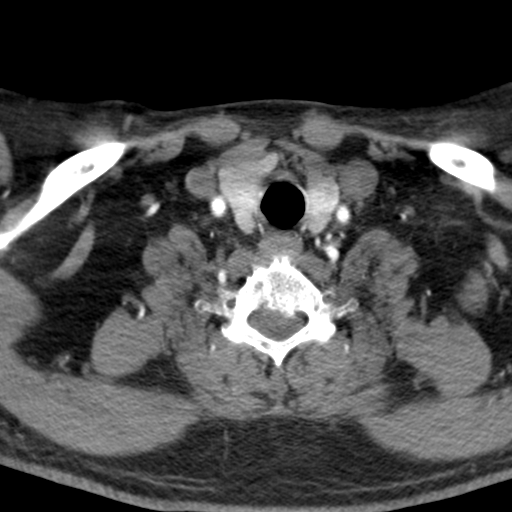
[im 65/130  soft-tissue]
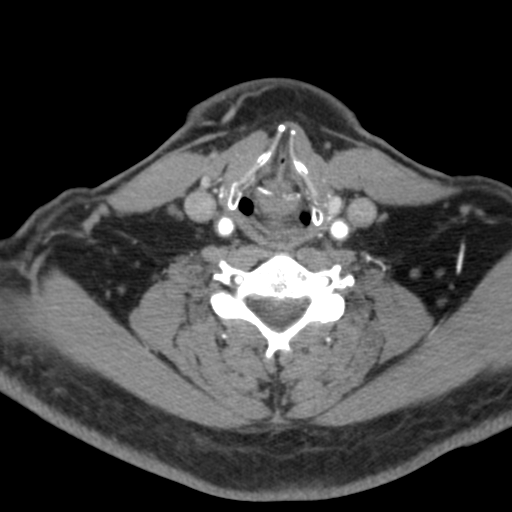
[im 81/130  soft-tissue]
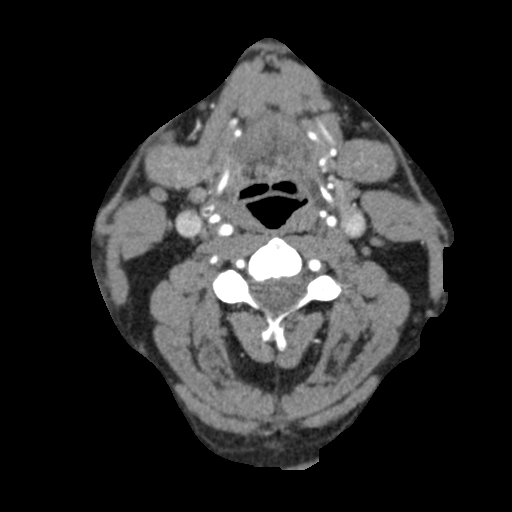
[im 97/130  soft-tissue]
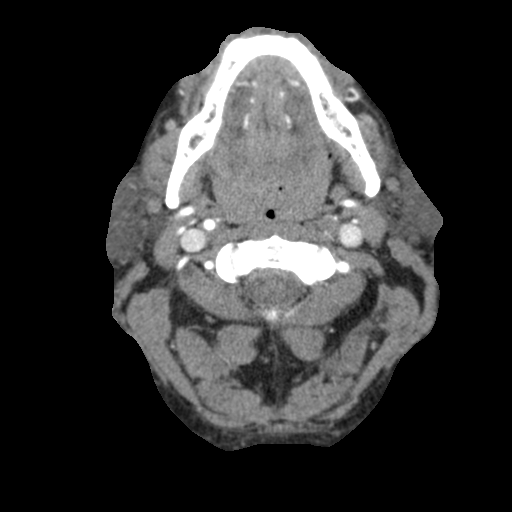
[im 113/130  soft-tissue]
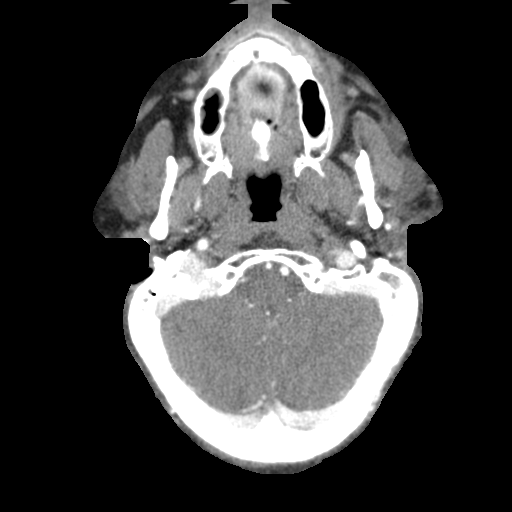

[Series 12: axial thick · axial · 0.38mm/px · z∈[-243,-159]mm · 2 of 51 slices shown, 5 images]
[im 17/51  soft-tissue]
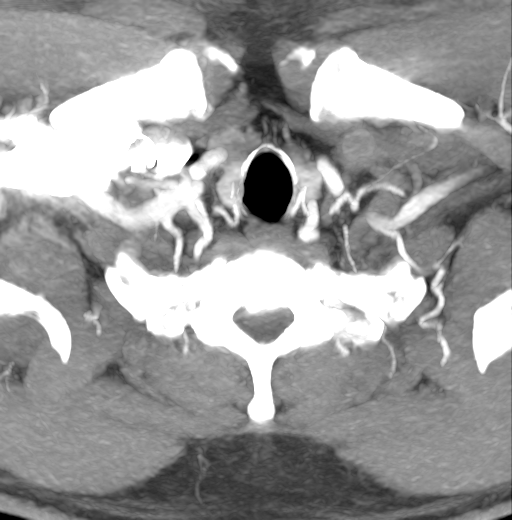
[im 17/51  lung]
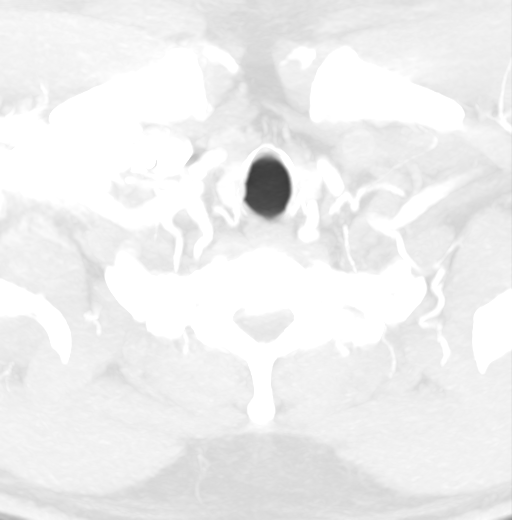
[im 17/51  bone]
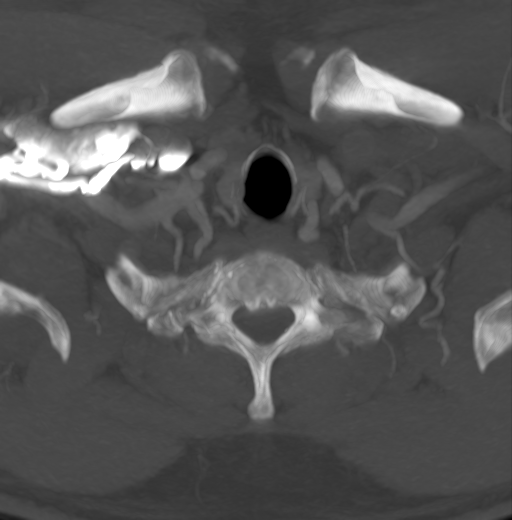
[im 34/51  soft-tissue]
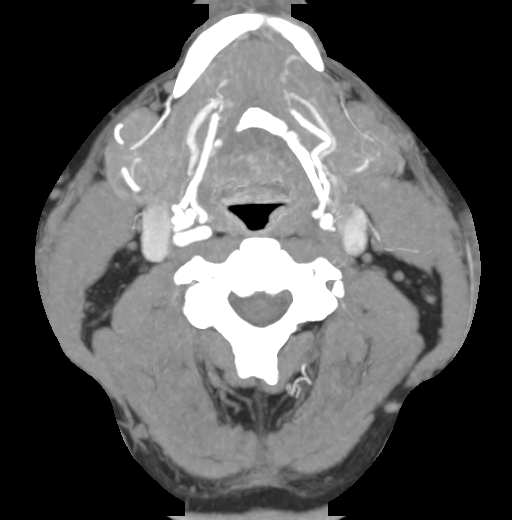
[im 34/51  lung]
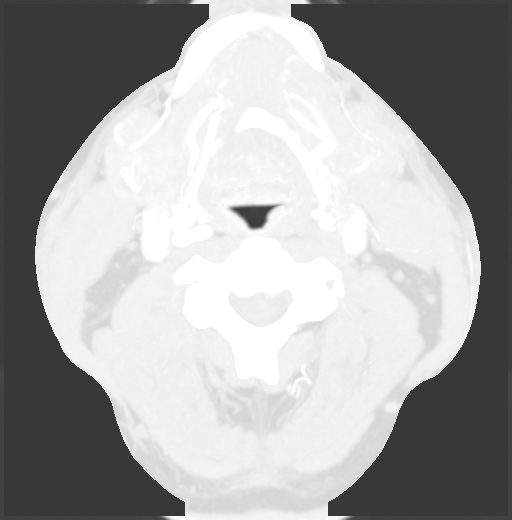

[9 of 32 positions shown; findings below may reference images not displayed]

FINDINGS: Aortic arch: Plaque is present along the visualized aortic arch and
at the patent great vessel origins. There is direct origin of the
left vertebral artery from the arch.

Right carotid system: Common, internal, and external carotid
arteries are patent. There is atherosclerotic wall thickening along
the common carotid. Calcified and noncalcified plaque is present at
the bifurcation and along the proximal ICA resulting in
approximately 50% stenosis.

Left carotid system: Common carotid artery is patent with mild
atherosclerotic wall thickening. There is primarily noncalcified
plaque at the bifurcation. The ICA is occluded from its origin
without reconstitution in the neck. There is severe stenosis at the
ECA origin. At least partial reconstitution of the ICA is noted
intracranially at the level of the proximal cavernous segment.

Vertebral arteries: Patent. Left vertebral artery is dominant. No
measurable stenosis

Skeleton: Cervical spine degenerative changes, greatest at C6-C7.

Other neck: Partially imaged paranasal sinus inflammatory changes
including a small left maxillary sinus air-fluid level. No neck mass
or adenopathy.

Upper chest: No apical lung mass.
IMPRESSION: Occlusion of the cervical left ICA. At least partial reconstitution
intracranially at the level of the proximal cavernous segment.
Remainder of intracranial portion is not included.

Plaque at the right ICA origin with approximately 50% stenosis.

Nonspecific left maxillary sinus air-fluid level, which can reflect
acute sinusitis in the appropriate clinical setting.

## 2021-02-09 DIAGNOSIS — E785 Hyperlipidemia, unspecified: Secondary | ICD-10-CM | POA: Diagnosis not present

## 2021-03-10 ENCOUNTER — Ambulatory Visit: Payer: BC Managed Care – PPO | Admitting: Pulmonary Disease

## 2021-03-10 ENCOUNTER — Encounter: Payer: Self-pay | Admitting: Pulmonary Disease

## 2021-03-10 ENCOUNTER — Other Ambulatory Visit: Payer: Self-pay

## 2021-03-10 VITALS — BP 132/86 | HR 72 | Ht 68.0 in | Wt 295.6 lb

## 2021-03-10 DIAGNOSIS — R0602 Shortness of breath: Secondary | ICD-10-CM | POA: Diagnosis not present

## 2021-03-10 DIAGNOSIS — G4733 Obstructive sleep apnea (adult) (pediatric): Secondary | ICD-10-CM | POA: Diagnosis not present

## 2021-03-10 DIAGNOSIS — Z87891 Personal history of nicotine dependence: Secondary | ICD-10-CM

## 2021-03-10 MED ORDER — STIOLTO RESPIMAT 2.5-2.5 MCG/ACT IN AERS: 2.0000 | INHALATION_SPRAY | Freq: Every day | RESPIRATORY_TRACT | 0 refills | Status: DC

## 2021-03-10 NOTE — Progress Notes (Signed)
Synopsis: Referred in February 2023 for COPD by Shelbie Proctor, NP  Subjective:   PATIENT ID: Francisco Gross GENDER: male DOB: [birth year], MRN: [id number]  HPI  Chief Complaint  Patient presents with   Consult    Referred by occupational health for history of COPD and increased SOB over the past 3 years.    Francisco Gross is a 61 year old male, former smoker with history of CKD, HLD, CVA and sleep apnea who is referred to pulmonary clinic for evaluation of COPD.   He reports shortness of breath over the past 3 years that has been progressive. He denies cough or wheezing. He has gained 60lbs in the last 3 years. He drinks 20-40oz of mountain dew daily. He had a stroke in 2020 and quit smoking at that time. He has a 40+ pack year smoking history.   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   He has CPAP machine at home but does not use it regularly due to issues with mask fit and having it come off at various times during the night.   He had secondhand smoke exposure. His father died of lung cancer. Mother had bone cancer and a sister had breast cancer.    Past Medical History:  Diagnosis Date   Chronic kidney disease    kidney stones    Hyperlipidemia    Hypertension    Stroke [facility])      Family History  Problem Relation Age of Onset   Breast cancer Mother    Lung cancer Father    Breast cancer Sister    Colon cancer Neg Hx    Rectal cancer Neg Hx    Stomach cancer Neg Hx      Social History   Socioeconomic History   Marital status: Married    Spouse name: Not on file   Number of children: 2   Years of education: Not on file   Highest education level: Not on file  Occupational History   Not on file  Tobacco Use   Smoking status: Former    Packs/day: 1.50    Years: 40.00    Pack years: 60.00    Types: Cigarettes    Start date: 08/09/1975    Quit  date: 01/18/2019    Years since quitting: 2.1   Smokeless tobacco: Never  Vaping Use   Vaping Use: Never used  Substance and Sexual Activity   Alcohol use: Not Currently    Alcohol/week: 0.0 standard drinks   Drug use: No   Sexual activity: Not on file  Other Topics Concern   Not on file  Social History Narrative   Right handed   One story home   Drinks one cup of coffe daily   Social Determinants of Health   Financial Resource Strain: Not on file  Food Insecurity: Not on file  Transportation Needs: Not on file  Physical Activity: Not on file  Stress: Not on file  Social Connections: Not on file  Intimate Partner Violence: Not on file     No Known Allergies   Outpatient Medications Prior to Visit  Medication Sig Dispense Refill   amLODipine (NORVASC) 5 MG tablet Take 1 tablet (5 mg total) by mouth every evening. 90 tablet 3   aspirin EC 81 MG tablet Take 1 tablet (81 mg total) by mouth daily. 90 tablet 3   fenofibrate (TRICOR) 145  MG tablet Take 145 mg by mouth daily.     losartan-hydrochlorothiazide (HYZAAR) 100-25 MG tablet Take 1 tablet by mouth in the morning. 90 tablet 3   metoprolol succinate (TOPROL-XL) 100 MG 24 hr tablet Take 100 mg by mouth daily.     omega-3 acid ethyl esters (LOVAZA) 1 g capsule Take 2 capsules by mouth 2 (two) times daily.     REPATHA SURECLICK 140 MG/ML SOAJ SMARTSIG:140 Milligram(s) SUB-Q Every 2 Weeks     rosuvastatin (CRESTOR) 20 MG tablet Take 20 mg by mouth at bedtime.     vitamin B-12 (CYANOCOBALAMIN) 1000 MCG tablet Take 1,000 mcg by mouth daily.     Vitamin D, Ergocalciferol, (DRISDOL) 1.25 MG (50000 UNIT) CAPS capsule Take 50,000 Units by mouth once a week.     ezetimibe (ZETIA) 10 MG tablet Take 1 tablet (10 mg total) by mouth daily. 90 tablet 3   No facility-administered medications prior to visit.    Review of Systems  Constitutional:  Negative for chills, fever, malaise/fatigue and weight loss.  HENT:  Negative for  congestion, sinus pain and sore throat.   Eyes: Negative.   Respiratory:  Positive for shortness of breath. Negative for cough, hemoptysis, sputum production and wheezing.   Cardiovascular:  Negative for chest pain, palpitations, orthopnea, claudication and leg swelling.  Gastrointestinal:  Negative for abdominal pain, heartburn, nausea and vomiting.  Genitourinary: Negative.   Musculoskeletal:  Negative for joint pain and myalgias.  Skin:  Negative for rash.  Neurological:  Negative for weakness.  Endo/Heme/Allergies: Negative.   Psychiatric/Behavioral: Negative.       Objective:   Vitals:   03/10/21 1509  BP: 132/86  Pulse: 72  SpO2: 96%  Weight: 295 lb 10.4 oz (134.1 kg)  Height: 5\' 8"  (1.727 m)     Physical Exam Constitutional:      General: He is not in acute distress.    Appearance: He is obese.  HENT:     Head: Normocephalic and atraumatic.  Eyes:     Extraocular Movements: Extraocular movements intact.     Conjunctiva/sclera: Conjunctivae normal.     Pupils: Pupils are equal, round, and reactive to light.  Cardiovascular:     Rate and Rhythm: Normal rate and regular rhythm.     Pulses: Normal pulses.     Heart sounds: Normal heart sounds. No murmur heard. Pulmonary:     Effort: Pulmonary effort is normal.     Breath sounds: Normal breath sounds. No wheezing, rhonchi or rales.  Abdominal:     General: Bowel sounds are normal.     Palpations: Abdomen is soft.  Musculoskeletal:     Right lower leg: No edema.     Left lower leg: No edema.  Lymphadenopathy:     Cervical: No cervical adenopathy.  Skin:    General: Skin is warm and dry.  Neurological:     General: No focal deficit present.     Mental Status: He is alert.  Psychiatric:        Mood and Affect: Mood normal.        Behavior: Behavior normal.        Thought Content: Thought content normal.        Judgment: Judgment normal.   CBC No results found for: WBC, RBC, HGB, HCT, PLT, MCV, MCH, MCHC,  RDW, LYMPHSABS, MONOABS, EOSABS, BASOSABS  Chest imaging:  PFT: No flowsheet data found.  Labs:  Path:  Echo:  Heart Catheterization:  Assessment & Plan:  Shortness of breath - Plan: Pulmonary Function Test  Obstructive sleep apnea syndrome  Former smoker - Plan: Ambulatory Referral for Lung Cancer Scre  Discussion: Francisco Gross is a 61 year old male, former smoker with history of CKD, HLD, CVA and sleep apnea who is referred to pulmonary clinic for evaluation of COPD.   He has risk factors for obstructive lung disease given his smoking history and exposure to various construction dusts.   He is to start stiolto inhaler 2 puffs daily and monitor for improvement of his symptoms.  We will refer him to our lung cancer screening program for CT Chest scans.   Follow up in 6 weeks with pulmonary function tests.  Melody Comas, MD [facility] Office: [phone]   Current Outpatient Medications:    amLODipine (NORVASC) 5 MG tablet, Take 1 tablet (5 mg total) by mouth every evening., Disp: 90 tablet, Rfl: 3   aspirin EC 81 MG tablet, Take 1 tablet (81 mg total) by mouth daily., Disp: 90 tablet, Rfl: 3   fenofibrate (TRICOR) 145 MG tablet, Take 145 mg by mouth daily., Disp: , Rfl:    losartan-hydrochlorothiazide (HYZAAR) 100-25 MG tablet, Take 1 tablet by mouth in the morning., Disp: 90 tablet, Rfl: 3   metoprolol succinate (TOPROL-XL) 100 MG 24 hr tablet, Take 100 mg by mouth daily., Disp: , Rfl:    omega-3 acid ethyl esters (LOVAZA) 1 g capsule, Take 2 capsules by mouth 2 (two) times daily., Disp: , Rfl:    REPATHA SURECLICK 140 MG/ML SOAJ, SMARTSIG:140 Milligram(s) SUB-Q Every 2 Weeks, Disp: , Rfl:    rosuvastatin (CRESTOR) 20 MG tablet, Take 20 mg by mouth at bedtime., Disp: , Rfl:    Tiotropium Bromide-Olodaterol (STIOLTO RESPIMAT) 2.5-2.5 MCG/ACT AERS, Inhale 2 puffs into the lungs daily., Disp: 4 g, Rfl: 0   vitamin B-12 (CYANOCOBALAMIN) 1000  MCG tablet, Take 1,000 mcg by mouth daily., Disp: , Rfl:    Vitamin D, Ergocalciferol, (DRISDOL) 1.25 MG (50000 UNIT) CAPS capsule, Take 50,000 Units by mouth once a week., Disp: , Rfl:    ezetimibe (ZETIA) 10 MG tablet, Take 1 tablet (10 mg total) by mouth daily., Disp: 90 tablet, Rfl: 3

## 2021-03-10 NOTE — Patient Instructions (Signed)
Try stiolto inhaler 2 puffs daily  We will refer you to our lung cancer screening program   We will schedule you for pulmonary function tests and follow up in 6 weeks

## 2021-04-13 DIAGNOSIS — E785 Hyperlipidemia, unspecified: Secondary | ICD-10-CM | POA: Diagnosis not present

## 2021-04-13 DIAGNOSIS — Z125 Encounter for screening for malignant neoplasm of prostate: Secondary | ICD-10-CM | POA: Diagnosis not present

## 2021-04-19 DIAGNOSIS — I10 Essential (primary) hypertension: Secondary | ICD-10-CM | POA: Diagnosis not present

## 2021-04-19 DIAGNOSIS — Z1331 Encounter for screening for depression: Secondary | ICD-10-CM | POA: Diagnosis not present

## 2021-04-19 DIAGNOSIS — Z1339 Encounter for screening examination for other mental health and behavioral disorders: Secondary | ICD-10-CM | POA: Diagnosis not present

## 2021-04-19 DIAGNOSIS — R82998 Other abnormal findings in urine: Secondary | ICD-10-CM | POA: Diagnosis not present

## 2021-04-19 DIAGNOSIS — Z0000 Encounter for general adult medical examination without abnormal findings: Secondary | ICD-10-CM | POA: Diagnosis not present

## 2021-04-27 ENCOUNTER — Other Ambulatory Visit: Payer: Self-pay | Admitting: Cardiology

## 2021-04-27 DIAGNOSIS — I10 Essential (primary) hypertension: Secondary | ICD-10-CM

## 2021-05-03 ENCOUNTER — Encounter: Payer: Self-pay | Admitting: Pulmonary Disease

## 2021-05-03 ENCOUNTER — Other Ambulatory Visit: Payer: Self-pay

## 2021-05-03 ENCOUNTER — Ambulatory Visit (INDEPENDENT_AMBULATORY_CARE_PROVIDER_SITE_OTHER): Payer: BC Managed Care – PPO | Admitting: Pulmonary Disease

## 2021-05-03 VITALS — BP 130/72 | HR 57 | Ht 68.0 in | Wt 287.0 lb

## 2021-05-03 DIAGNOSIS — R0602 Shortness of breath: Secondary | ICD-10-CM | POA: Diagnosis not present

## 2021-05-03 DIAGNOSIS — R942 Abnormal results of pulmonary function studies: Secondary | ICD-10-CM

## 2021-05-03 DIAGNOSIS — Z87891 Personal history of nicotine dependence: Secondary | ICD-10-CM

## 2021-05-03 DIAGNOSIS — G4733 Obstructive sleep apnea (adult) (pediatric): Secondary | ICD-10-CM

## 2021-05-03 LAB — PULMONARY FUNCTION TEST
DL/VA % pred: 125 %
DL/VA: 5.4 ml/min/mmHg/L
DLCO cor % pred: 102 %
DLCO cor: 26.75 ml/min/mmHg
DLCO unc % pred: 102 %
DLCO unc: 26.75 ml/min/mmHg
FEF 25-75 Post: 2.24 L/sec
FEF 25-75 Pre: 1.92 L/sec
FEF2575-%Change-Post: 16 %
FEF2575-%Pred-Post: 78 %
FEF2575-%Pred-Pre: 67 %
FEV1-%Change-Post: 3 %
FEV1-%Pred-Post: 64 %
FEV1-%Pred-Pre: 62 %
FEV1-Post: 2.2 L
FEV1-Pre: 2.13 L
FEV1FVC-%Change-Post: -1 %
FEV1FVC-%Pred-Pre: 103 %
FEV6-%Change-Post: 5 %
FEV6-%Pred-Post: 66 %
FEV6-%Pred-Pre: 63 %
FEV6-Post: 2.85 L
FEV6-Pre: 2.71 L
FEV6FVC-%Pred-Post: 104 %
FEV6FVC-%Pred-Pre: 104 %
FVC-%Change-Post: 5 %
FVC-%Pred-Post: 63 %
FVC-%Pred-Pre: 60 %
FVC-Post: 2.85 L
FVC-Pre: 2.71 L
Post FEV1/FVC ratio: 77 %
Post FEV6/FVC ratio: 100 %
Pre FEV1/FVC ratio: 78 %
Pre FEV6/FVC Ratio: 100 %
RV % pred: 135 %
RV: 2.86 L
TLC % pred: 91 %
TLC: 6.01 L

## 2021-05-03 MED ORDER — STIOLTO RESPIMAT 2.5-2.5 MCG/ACT IN AERS: 2.0000 | INHALATION_SPRAY | Freq: Every day | RESPIRATORY_TRACT | 11 refills | Status: DC

## 2021-05-03 NOTE — Progress Notes (Signed)
Full PFT performed today. °

## 2021-05-03 NOTE — Patient Instructions (Signed)
Full PFT performed today. °

## 2021-05-03 NOTE — Patient Instructions (Addendum)
Your pulmonary function tests show a non-specific pattern which can be concerning for COPD.  ? ?Continue stiolto inhaler 2 puffs daily ? ?Use albuterol inhaler as needed 1-2 puffs every 4-6 hours ? ?We will check on the referral to our lung cancer screening program ? ?Continue to work on weight loss  ? ?Follow up in 1 year ?

## 2021-05-03 NOTE — Progress Notes (Signed)
? ?Synopsis: Referred in February 2023 for COPD by Shelbie Proctor, NP ? ?Subjective:  ? ?PATIENT ID: Francisco Gross GENDER: male DOB: 1960/09/27, MRN: [id number] ? ?HPI ? ?Chief Complaint  ?Patient presents with  ? Follow-up  ?  F/U after PFT. States his breathing has improved while using Stiolto.   ? ?Marshall Kampf is a 61 year old male, former smoker with history of CKD, HLD, CVA and sleep apnea who returns to pulmonary clinic for evaluation of COPD.  ? ?He was provided trial of stiolto at last visit with improvement in his breathing. He has lost 8lbs since last visit as he cut soda out of his diet.  ? ?PFTs today show non-specific pulmonary function pattern with air trapping and normal DLCO.  ? ?OV 03/10/21 ?He reports shortness of breath over the past 3 years that has been progressive. He denies cough or wheezing. He has gained 60lbs in the last 3 years. He drinks 20-40oz of mountain dew daily. He had a stroke in 2020 and quit smoking at that time. He has a 40+ pack year smoking history.  ? 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  ? ?He has CPAP machine at home but does not use it regularly due to issues with mask fit and having it come off at various times during the night.  ? ?He had secondhand smoke exposure. His father died of lung cancer. Mother had bone cancer and a sister had breast cancer.  ? ? ?Past Medical History:  ?Diagnosis Date  ? Chronic kidney disease   ? kidney stones   ? Hyperlipidemia   ? Hypertension   ? Stroke [facility])   ?  ? ?Family History  ?Problem Relation Age of Onset  ? Breast cancer Mother   ? Lung cancer Father   ? Breast cancer Sister   ? Colon cancer Neg Hx   ? Rectal cancer Neg Hx   ? Stomach cancer Neg Hx   ?  ? ?Social History  ? ?Socioeconomic History  ? Marital status: Married  ?  Spouse name: Not on file  ? Number of children: 2  ? Years of education: Not on file  ?  Highest education level: Not on file  ?Occupational History  ? Not on file  ?Tobacco Use  ? Smoking status: Former  ?  Packs/day: 1.50  ?  Years: 40.00  ?  Pack years: 60.00  ?  Types: Cigarettes  ?  Start date: 08/09/1975  ?  Quit date: 01/18/2019  ?  Years since quitting: 2.2  ? Smokeless tobacco: Never  ?Vaping Use  ? Vaping Use: Never used  ?Substance and Sexual Activity  ? Alcohol use: Not Currently  ?  Alcohol/week: 0.0 standard drinks  ? Drug use: No  ? Sexual activity: Not on file  ?Other Topics Concern  ? Not on file  ?Social History Narrative  ? Right handed  ? One story home  ? Drinks one cup of coffe daily  ? ?Social Determinants of Health  ? ?Financial Resource Strain: Not on file  ?Food Insecurity: Not on file  ?Transportation Needs: Not on file  ?Physical Activity: Not on file  ?Stress: Not on file  ?Social Connections: Not on file  ?Intimate Partner Violence: Not on file  ?  ? ?No Known Allergies  ? ?Outpatient Medications Prior to Visit  ?Medication Sig Dispense Refill  ?  aspirin EC 81 MG tablet Take 1 tablet (81 mg total) by mouth daily. 90 tablet 3  ? fenofibrate (TRICOR) 145 MG tablet Take 145 mg by mouth daily.    ? losartan-hydrochlorothiazide (HYZAAR) 100-25 MG tablet TAKE ONE TABLET BY MOUTH EVERY MORNING 90 tablet 3  ? metoprolol succinate (TOPROL-XL) 100 MG 24 hr tablet Take 100 mg by mouth daily.    ? omega-3 acid ethyl esters (LOVAZA) 1 g capsule Take 2 capsules by mouth 2 (two) times daily.    ? REPATHA SURECLICK 140 MG/ML SOAJ SMARTSIG:140 Milligram(s) SUB-Q Every 2 Weeks    ? rosuvastatin (CRESTOR) 20 MG tablet Take 20 mg by mouth at bedtime.    ? vitamin B-12 (CYANOCOBALAMIN) 1000 MCG tablet Take 1,000 mcg by mouth daily.    ? Vitamin D, Ergocalciferol, (DRISDOL) 1.25 MG (50000 UNIT) CAPS capsule Take 50,000 Units by mouth once a week.    ? Tiotropium Bromide-Olodaterol (STIOLTO RESPIMAT) 2.5-2.5 MCG/ACT AERS Inhale 2 puffs into the lungs daily. 4 g 0  ? amLODipine (NORVASC) 5 MG tablet  Take 1 tablet (5 mg total) by mouth every evening. 90 tablet 3  ? ezetimibe (ZETIA) 10 MG tablet Take 1 tablet (10 mg total) by mouth daily. 90 tablet 3  ? ?No facility-administered medications prior to visit.  ? ? ?Review of Systems  ?Constitutional:  Negative for chills, fever, malaise/fatigue and weight loss.  ?HENT:  Negative for congestion, sinus pain and sore throat.   ?Eyes: Negative.   ?Respiratory:  Positive for shortness of breath. Negative for cough, hemoptysis, sputum production and wheezing.   ?Cardiovascular:  Negative for chest pain, palpitations, orthopnea, claudication and leg swelling.  ?Gastrointestinal:  Negative for abdominal pain, heartburn, nausea and vomiting.  ?Genitourinary: Negative.   ?Musculoskeletal:  Negative for joint pain and myalgias.  ?Skin:  Negative for rash.  ?Neurological:  Negative for weakness.  ?Endo/Heme/Allergies: Negative.   ?Psychiatric/Behavioral: Negative.    ? ? ? ?Objective:  ? ?Vitals:  ? 05/03/21 1556  ?BP: 130/72  ?Pulse: (!) 57  ?SpO2: 95%  ?Weight: 287 lb (130.2 kg)  ?Height: 5\' 8"  (1.727 m)  ? ? ? ?Physical Exam ?Constitutional:   ?   General: He is not in acute distress. ?   Appearance: He is obese.  ?HENT:  ?   Head: Normocephalic and atraumatic.  ?Eyes:  ?   Extraocular Movements: Extraocular movements intact.  ?   Conjunctiva/sclera: Conjunctivae normal.  ?   Pupils: Pupils are equal, round, and reactive to light.  ?Cardiovascular:  ?   Rate and Rhythm: Normal rate and regular rhythm.  ?   Pulses: Normal pulses.  ?   Heart sounds: Normal heart sounds. No murmur heard. ?Pulmonary:  ?   Effort: Pulmonary effort is normal.  ?   Breath sounds: Normal breath sounds. No wheezing, rhonchi or rales.  ?Abdominal:  ?   General: Bowel sounds are normal.  ?   Palpations: Abdomen is soft.  ?Musculoskeletal:  ?   Right lower leg: No edema.  ?   Left lower leg: No edema.  ?Lymphadenopathy:  ?   Cervical: No cervical adenopathy.  ?Skin: ?   General: Skin is warm and dry.   ?Neurological:  ?   General: No focal deficit present.  ?   Mental Status: He is alert.  ?Psychiatric:     ?   Mood and Affect: Mood normal.     ?   Behavior: Behavior normal.     ?  Thought Content: Thought content normal.     ?   Judgment: Judgment normal.  ? ?CBC ?No results found for: WBC, RBC, HGB, HCT, PLT, MCV, MCH, MCHC, RDW, LYMPHSABS, MONOABS, EOSABS, BASOSABS ? ?Chest imaging: ? ?PFT: ? ?  Latest Ref Rng & Units 05/03/2021  ?  2:46 PM  ?PFT Results  ?FVC-Pre L 2.71    ?FVC-Predicted Pre % 60    ?FVC-Post L 2.85    ?FVC-Predicted Post % 63    ?Pre FEV1/FVC % % 78    ?Post FEV1/FCV % % 77    ?FEV1-Pre L 2.13    ?FEV1-Predicted Pre % 62    ?FEV1-Post L 2.20    ?DLCO uncorrected ml/min/mmHg 26.75    ?DLCO UNC% % 102    ?DLCO corrected ml/min/mmHg 26.75    ?DLCO COR %Predicted % 102    ?DLVA Predicted % 125    ?TLC L 6.01    ?TLC % Predicted % 91    ?RV % Predicted % 135    ? ? ?Labs: ? ?Path: ? ?Echo: ? ?Heart Catheterization: ? ?Assessment & Plan:  ? ?Abnormal PFT - Plan: Tiotropium Bromide-Olodaterol (STIOLTO RESPIMAT) 2.5-2.5 MCG/ACT AERS ? ?Obstructive sleep apnea syndrome ? ?Former smoker ? ?Discussion: ?Elgie CollardKeith Seger is a 61 year old male, former smoker with history of CKD, HLD, CVA and sleep apnea who returns to pulmonary clinic for evaluation of COPD.  ? ?Based on his non-specific PFT pattern with signs of air-trapping there is concern for COPD. He noted relief with LAMA/LABA therapy so we will send in prescription for him to continue on stiolto 2 puffs daily. He can use albuterol inhaler as needed.  ? ? ?He has risk factors for obstructive lung disease given his smoking history and exposure to various construction dusts.  ? ?He is to start stiolto inhaler 2 puffs daily and monitor for improvement of his symptoms. ? ?He has been referred to our lung cancer screening program. ? ?Encouraged further weight reduction. ? ?Follow up in 1 year. ? ?Melody ComasJonathan Nikko Quast, MD ?[facility] ?Office: [phone] ? ? ?Current Outpatient Medications:  ?  aspirin EC 81 MG tablet, Take 1 tablet (81 mg total) by mouth daily., Disp: 90 tablet, Rfl: 3 ?  fenofibrate (TRICOR) 145 MG tablet, Take 145

## 2021-05-06 ENCOUNTER — Encounter: Payer: Self-pay | Admitting: Pulmonary Disease

## 2021-12-31 ENCOUNTER — Ambulatory Visit: Payer: BC Managed Care – PPO | Admitting: Cardiology

## 2022-01-06 ENCOUNTER — Encounter: Payer: Self-pay | Admitting: Cardiology

## 2022-01-06 ENCOUNTER — Ambulatory Visit: Payer: BC Managed Care – PPO | Admitting: Cardiology

## 2022-01-06 VITALS — BP 137/60 | HR 67 | Resp 16 | Ht 68.0 in | Wt 292.0 lb

## 2022-01-06 DIAGNOSIS — E782 Mixed hyperlipidemia: Secondary | ICD-10-CM | POA: Diagnosis not present

## 2022-01-06 DIAGNOSIS — R011 Cardiac murmur, unspecified: Secondary | ICD-10-CM | POA: Diagnosis not present

## 2022-01-06 DIAGNOSIS — I10 Essential (primary) hypertension: Secondary | ICD-10-CM | POA: Diagnosis not present

## 2022-01-06 NOTE — Progress Notes (Signed)
Patient referred by Geannie Risen, MD for cardiac risk stratification  Subjective:   Francisco Gross, male    DOB: 18-Nov-1960, 61 y.o.   MRN: [id number]   Chief Complaint  Patient presents with   Hypertension   Congestive Heart Failure   Follow-up    1 year    61 y.o. male with hypertension, former smoker, stroke (01/2019)  Patient is doing well, denies chest pain, shortness of breath, palpitations, orthopnea, PND, TIA/syncope, has unchanged mild leg edema. Recently. Hypertension and hyperlipidemia medications dosages were changed by PCP according to the patient, details now available to me. BP elevated on first check today, improved on second check.    Current Outpatient Medications:    amLODipine (NORVASC) 5 MG tablet, Take 1 tablet (5 mg total) by mouth every evening., Disp: 90 tablet, Rfl: 3   aspirin EC 81 MG tablet, Take 1 tablet (81 mg total) by mouth daily., Disp: 90 tablet, Rfl: 3   ezetimibe (ZETIA) 10 MG tablet, Take 1 tablet (10 mg total) by mouth daily., Disp: 90 tablet, Rfl: 3   fenofibrate (TRICOR) 145 MG tablet, Take 145 mg by mouth daily., Disp: , Rfl:    losartan-hydrochlorothiazide (HYZAAR) 100-25 MG tablet, TAKE ONE TABLET BY MOUTH EVERY MORNING, Disp: 90 tablet, Rfl: 3   metoprolol succinate (TOPROL-XL) 100 MG 24 hr tablet, Take 100 mg by mouth daily., Disp: , Rfl:    omega-3 acid ethyl esters (LOVAZA) 1 g capsule, Take 2 capsules by mouth 2 (two) times daily., Disp: , Rfl:    REPATHA SURECLICK 160 MG/ML SOAJ, SMARTSIG:140 Milligram(s) SUB-Q Every 2 Weeks, Disp: , Rfl:    rosuvastatin (CRESTOR) 20 MG tablet, Take 20 mg by mouth at bedtime., Disp: , Rfl:    Tiotropium Bromide-Olodaterol (STIOLTO RESPIMAT) 2.5-2.5 MCG/ACT AERS, Inhale 2 puffs into the lungs daily., Disp: 4 g, Rfl: 11   vitamin B-12 (CYANOCOBALAMIN) 1000 MCG tablet, Take 1,000 mcg by mouth daily., Disp: , Rfl:    Vitamin D, Ergocalciferol, (DRISDOL) 1.25 MG (50000 UNIT) CAPS capsule, Take 50,000  Units by mouth once a week., Disp: , Rfl:   Cardiovascular and other pertinent studies:  EKG 01/06/2022: Sinus rhythm 63 bpm Occasional PAC   Left atrial enlargement Nonspecific T wave abnormality  Echocardiogram 03/23/2020:  Left ventricle cavity is normal in size. Moderate concentric hypertrophy  of the left ventricle. Normal global wall motion. Normal LV systolic  function with visual EF 55-60%. Doppler evidence of grade I (impaired)  diastolic dysfunction, normal LAP.  Trileaflet aortic valve with aortic valve sclerosis without stenosis.  Mild (Grade I) mitral regurgitation.  No evidence of pulmonary hypertension.  CT Angio Neck 04/05/2019: Occlusion of the cervical left ICA. At least partial reconstitution intracranially at the level of the proximal cavernous segment. Remainder of intracranial portion is not included. Plaque at the right ICA origin with approximately 50% stenosis. Nonspecific left maxillary sinus air-fluid level, which can reflect acute sinusitis in the appropriate clinical setting.   ABI 02/19/2019:  This exam reveals normal perfusion of the right lower extremity (ABI 1.00). This exam reveals normal perfusion of the left lower extremity (ABI 1.00).    Lexiscan Sestamibi Stress Test 02/18/2019: Nondiagnostic ECG stress due to pharmacologic stress. oft tissue attenuation in the anterior wall. Normal myocardial perfusion. without ischemia or scar All segments of left ventricle demonstrated normal wall motion and thickening. Stress LV EF is normal 57%.  No previous exam available for comparison. Low risk study.   CT head  01/2019: Possible left frontal lobe acute infarct   Recent labs: 12/18/2021: Glucose 99, BUN/Cr 32/1.4. EGFR 58. Na/K 140/5.3. Rest of the CMP normal H/H 14/44. MCV 91. Platelets 220 HbA1C 5.9% Chol 68, TG 70, HDL 33, LDL 9   12/15/2020: Glucose 99, BUN/Cr 27/1.49. EGFR 54. Na/K 144/5.3. Rest of the CMP normal H/H 15/46. MCV 92.  Platelets 224 HbA1C 6.0% Chol 143, TG 99, HDL 40, LDL 84 TSH 1.1 normal  03/17/2019: Glucose 106, BUN/Cr 27/1.24. EGFR 64. Na/K 142/4.6. Rest of the CMP normal Chol 153, TG 121, HDL 41, LDL 90  07/18/2019: BNP 9 normal  01/21/2019: Glucose 107, BUN/Cr 21/1.1. eGFR >60. Na/K 139/4.4. Rest of the CMP normal. H/H 15.7/46.8. Platelets 162    Review of Systems  Cardiovascular:  Positive for dyspnea on exertion (Improved). Negative for chest pain, leg swelling, palpitations and syncope.  Musculoskeletal:  Positive for back pain.         Vitals:   01/06/22 0920  BP: (!) 147/59  Pulse: 67  Resp: 16  SpO2: 93%     Body mass index is 44.4 kg/m. Filed Weights   01/06/22 0920  Weight: 292 lb (132.5 kg)     Objective:   Physical Exam Vitals and nursing note reviewed.  Constitutional:      Appearance: He is well-developed.  Neck:     Vascular: No JVD.  Cardiovascular:     Rate and Rhythm: Normal rate and regular rhythm.     Pulses:          Carotid pulses are  on the right side with bruit and  on the left side with bruit.      Dorsalis pedis pulses are 1+ on the right side and 1+ on the left side.       Posterior tibial pulses are 0 on the right side and 0 on the left side.     Heart sounds: Murmur heard.     Harsh midsystolic murmur is present with a grade of 2/6 at the upper right sternal border radiating to the neck.  Pulmonary:     Effort: Pulmonary effort is normal.     Breath sounds: Normal breath sounds. No wheezing or rales.  Musculoskeletal:     Right lower leg: Edema (Trace) present.     Left lower leg: Edema (Trace) present.         Assessment & Recommendations:   61 y.o. male with hypertension, former smoker, stroke (01/2019)  Hypertension: Variable. Keep a home log and bring to the next visit.  No change made today.  H/o stroke: On Aspirin 81, lipitor 80. Known occluded left carotid.   Moderate right carotid stenosis.   Continue risk  factor modification.   Lipids very well controlled Continue aspirin 81 mg daily, Crestor 40 mg daily, Zetia was discontinued.  Murmur: Aortic sclerosis at baseline. Murmur is now more prominent. Check echocardiogram.  F/u in 3-4 weeks    Nigel Mormon, MD Pager: [phone] Office: [phone]

## 2022-01-11 DIAGNOSIS — I6523 Occlusion and stenosis of bilateral carotid arteries: Secondary | ICD-10-CM | POA: Diagnosis not present

## 2022-01-19 ENCOUNTER — Ambulatory Visit: Payer: BC Managed Care – PPO

## 2022-01-19 DIAGNOSIS — I10 Essential (primary) hypertension: Secondary | ICD-10-CM | POA: Diagnosis not present

## 2022-01-24 NOTE — Progress Notes (Signed)
Patient referred by Geannie Risen, MD for cardiac risk stratification  Subjective:   Francisco Gross, male    DOB: [birth year], 61 y.o.   MRN: [id number]   Chief Complaint  Patient presents with   Follow-up    Echo   Hypertension   Hyperlipidemia   Congestive Heart Failure    61 y.o. male with hypertension, former smoker, stroke (01/2019)  No complaints today.  Blood pressure mains elevated.  Blood pressure readings are also elevated, as per the patient.  I do not have the log to review.    Current Outpatient Medications:    amLODipine (NORVASC) 5 MG tablet, Take 1 tablet (5 mg total) by mouth every evening. (Patient taking differently: Take 10 mg by mouth every evening.), Disp: 90 tablet, Rfl: 3   aspirin EC 81 MG tablet, Take 1 tablet (81 mg total) by mouth daily., Disp: 90 tablet, Rfl: 3   gabapentin (NEURONTIN) 100 MG capsule, Take 100 mg by mouth 3 (three) times daily., Disp: , Rfl:    losartan-hydrochlorothiazide (HYZAAR) 100-25 MG tablet, TAKE ONE TABLET BY MOUTH EVERY MORNING, Disp: 90 tablet, Rfl: 3   metoprolol succinate (TOPROL-XL) 100 MG 24 hr tablet, Take 100 mg by mouth daily., Disp: , Rfl:    omega-3 acid ethyl esters (LOVAZA) 1 g capsule, Take 2 capsules by mouth 2 (two) times daily., Disp: , Rfl:    REPATHA SURECLICK 440 MG/ML SOAJ, SMARTSIG:140 Milligram(s) SUB-Q Every 2 Weeks, Disp: , Rfl:    rosuvastatin (CRESTOR) 20 MG tablet, Take 20 mg by mouth at bedtime., Disp: , Rfl:    Tiotropium Bromide-Olodaterol (STIOLTO RESPIMAT) 2.5-2.5 MCG/ACT AERS, Inhale 2 puffs into the lungs daily., Disp: 4 g, Rfl: 11   Vitamin D, Ergocalciferol, (DRISDOL) 1.25 MG (50000 UNIT) CAPS capsule, Take 50,000 Units by mouth once a week., Disp: , Rfl:    hydrALAZINE (APRESOLINE) 50 MG tablet, Take 1 tablet (50 mg total) by mouth 3 (three) times daily., Disp: 90 tablet, Rfl: 3  Cardiovascular and other pertinent studies:  Echocardiogram 01/19/2022: Normal LV systolic function with  visual EF 60-65%. Left ventricle cavity is dilated. Normal left ventricular wall thickness. Normal global wall motion. Normal diastolic filling pattern, normal LAP. Aortic valve sclerosis without stenosis. Mild to moderate mitral regurgitation. Compared to 03/23/2020 G1DD is now normal, mild MR is now mild/moderate, otherwise no significant change.  EKG 01/06/2022: Sinus rhythm 63 bpm Occasional PAC   Left atrial enlargement Nonspecific T wave abnormality  CT Angio Neck 04/05/2019: Occlusion of the cervical left ICA. At least partial reconstitution intracranially at the level of the proximal cavernous segment. Remainder of intracranial portion is not included. Plaque at the right ICA origin with approximately 50% stenosis. Nonspecific left maxillary sinus air-fluid level, which can reflect acute sinusitis in the appropriate clinical setting.   ABI 02/19/2019:  This exam reveals normal perfusion of the right lower extremity (ABI 1.00). This exam reveals normal perfusion of the left lower extremity (ABI 1.00).   Lexiscan Sestamibi Stress Test 02/18/2019: Nondiagnostic ECG stress due to pharmacologic stress. oft tissue attenuation in the anterior wall. Normal myocardial perfusion. without ischemia or scar All segments of left ventricle demonstrated normal wall motion and thickening. Stress LV EF is normal 57%.  No previous exam available for comparison. Low risk study.   CT head 01/2019: Possible left frontal lobe acute infarct   Recent labs: 12/18/2021: Glucose 99, BUN/Cr 32/1.4. EGFR 58. Na/K 140/5.3. Rest of the CMP normal H/H 14/44. MCV 91.  Platelets 220 HbA1C 5.9% Chol 68, TG 70, HDL 33, LDL 9   12/15/2020: Glucose 99, BUN/Cr 27/1.49. EGFR 54. Na/K 144/5.3. Rest of the CMP normal H/H 15/46. MCV 92. Platelets 224 HbA1C 6.0% Chol 143, TG 99, HDL 40, LDL 84 TSH 1.1 normal  03/17/2019: Glucose 106, BUN/Cr 27/1.24. EGFR 64. Na/K 142/4.6. Rest of the CMP normal Chol 153, TG  121, HDL 41, LDL 90  07/18/2019: BNP 9 normal  01/21/2019: Glucose 107, BUN/Cr 21/1.1. eGFR >60. Na/K 139/4.4. Rest of the CMP normal. H/H 15.7/46.8. Platelets 162    Review of Systems  Cardiovascular:  Positive for dyspnea on exertion (Improved). Negative for chest pain, leg swelling, palpitations and syncope.  Musculoskeletal:  Positive for back pain.         Vitals:   01/28/22 0848  BP: (!) 159/67  Pulse: (!) 58  SpO2: 96%     Body mass index is 44.55 kg/m. Filed Weights   01/28/22 0848  Weight: 293 lb (132.9 kg)     Objective:   Physical Exam Vitals and nursing note reviewed.  Constitutional:      Appearance: He is well-developed.  Neck:     Vascular: No JVD.  Cardiovascular:     Rate and Rhythm: Normal rate and regular rhythm.     Pulses:          Carotid pulses are  on the right side with bruit and  on the left side with bruit.      Dorsalis pedis pulses are 1+ on the right side and 1+ on the left side.       Posterior tibial pulses are 0 on the right side and 0 on the left side.     Heart sounds: Murmur heard.     Harsh midsystolic murmur is present with a grade of 2/6 at the upper right sternal border radiating to the neck.  Pulmonary:     Effort: Pulmonary effort is normal.     Breath sounds: Normal breath sounds. No wheezing or rales.  Musculoskeletal:     Right lower leg: Edema (Trace) present.     Left lower leg: Edema (Trace) present.         Assessment & Recommendations:   61 y.o. male with hypertension, former smoker, stroke (01/2019), OSA not on CPAP  Hypertension: Uncontrolled. Increase hydralazine from 25 mg bid to 50 mg bid.  Continue amlodipine 10 mg daily, losartan-HCTZ 100-25 mg daily, metoprolol succinate 100 mg daily. Known OSA, unable to use CPAP in the past due to fitting issues. This could be contributing to uncontrolled hypertension. I will place a referral for repeat sleep study and mask fitting.   H/o stroke: Known  occluded left carotid.   Moderate right carotid stenosis.   Continue risk factor modification.   Lipids very well controlled Continue aspirin 81 mg daily, Crestor 40 mg daily, Zetia was discontinued.  Murmur: Aortic sclerosis, mid to mod MR. Clinically non-significant.  F/u in 3 months    Manish J Patwardhan, MD Pager: [phone] Office: [phone]  

## 2022-01-25 DIAGNOSIS — G4733 Obstructive sleep apnea (adult) (pediatric): Secondary | ICD-10-CM | POA: Diagnosis not present

## 2022-01-25 DIAGNOSIS — Z6841 Body Mass Index (BMI) 40.0 and over, adult: Secondary | ICD-10-CM | POA: Diagnosis not present

## 2022-01-28 ENCOUNTER — Encounter: Payer: Self-pay | Admitting: Cardiology

## 2022-01-28 ENCOUNTER — Ambulatory Visit: Payer: BC Managed Care – PPO | Admitting: Cardiology

## 2022-01-28 VITALS — BP 159/67 | HR 58 | Ht 68.0 in | Wt 293.0 lb

## 2022-01-28 DIAGNOSIS — I10 Essential (primary) hypertension: Secondary | ICD-10-CM

## 2022-01-28 DIAGNOSIS — G4733 Obstructive sleep apnea (adult) (pediatric): Secondary | ICD-10-CM | POA: Diagnosis not present

## 2022-01-28 DIAGNOSIS — E782 Mixed hyperlipidemia: Secondary | ICD-10-CM | POA: Diagnosis not present

## 2022-01-28 MED ORDER — HYDRALAZINE HCL 50 MG PO TABS: 50.0000 mg | ORAL_TABLET | Freq: Three times a day (TID) | ORAL | 3 refills | Status: AC

## 2022-03-07 ENCOUNTER — Encounter: Payer: Self-pay | Admitting: *Deleted

## 2022-04-07 ENCOUNTER — Encounter: Payer: Self-pay | Admitting: Cardiology

## 2022-04-07 ENCOUNTER — Ambulatory Visit: Payer: BC Managed Care – PPO | Admitting: Cardiology

## 2022-04-07 VITALS — BP 142/57 | HR 54 | Resp 16 | Ht 68.0 in | Wt 292.0 lb

## 2022-04-07 DIAGNOSIS — I10 Essential (primary) hypertension: Secondary | ICD-10-CM | POA: Diagnosis not present

## 2022-04-07 DIAGNOSIS — E782 Mixed hyperlipidemia: Secondary | ICD-10-CM

## 2022-04-07 DIAGNOSIS — R0609 Other forms of dyspnea: Secondary | ICD-10-CM | POA: Diagnosis not present

## 2022-04-07 NOTE — Progress Notes (Signed)
Patient referred by Geannie Risen, MD for cardiac risk stratification  Subjective:   Francisco Gross, male    DOB: [birth year], 61 y.o.   MRN: [id number]   Chief Complaint  Patient presents with   Hypertension   Follow-up    2 month    62 y.o. male with hypertension, former smoker, stroke (01/2019)  Patient started OSA for his CPAP for last 4 weeks.  Blood pressure has improved.  However, he continues to have dyspnea on minimal exertion.  He still has leg edema, that said he takes amlodipine 10 mg daily.  He is a former smoker and known to have mild COPD in the past.  He is scheduled to see a pulmonologist soon.  Current Outpatient Medications:    amLODipine (NORVASC) 5 MG tablet, Take 1 tablet (5 mg total) by mouth every evening. (Patient taking differently: Take 10 mg by mouth every evening.), Disp: 90 tablet, Rfl: 3   aspirin EC 81 MG tablet, Take 1 tablet (81 mg total) by mouth daily., Disp: 90 tablet, Rfl: 3   hydrALAZINE (APRESOLINE) 50 MG tablet, Take 1 tablet (50 mg total) by mouth 3 (three) times daily., Disp: 90 tablet, Rfl: 3   losartan-hydrochlorothiazide (HYZAAR) 50-12.5 MG tablet, Take 1 tablet by mouth daily., Disp: , Rfl:    metoprolol succinate (TOPROL-XL) 100 MG 24 hr tablet, Take 100 mg by mouth daily., Disp: , Rfl:    REPATHA SURECLICK XX123456 MG/ML SOAJ, SMARTSIG:140 Milligram(s) SUB-Q Every 2 Weeks, Disp: , Rfl:    rosuvastatin (CRESTOR) 20 MG tablet, Take 20 mg by mouth at bedtime., Disp: , Rfl:    Tiotropium Bromide-Olodaterol (STIOLTO RESPIMAT) 2.5-2.5 MCG/ACT AERS, Inhale 2 puffs into the lungs daily., Disp: 4 g, Rfl: 11   omega-3 acid ethyl esters (LOVAZA) 1 g capsule, Take 2 capsules by mouth 2 (two) times daily. (Patient not taking: Reported on 04/07/2022), Disp: , Rfl:    Vitamin D, Ergocalciferol, (DRISDOL) 1.25 MG (50000 UNIT) CAPS capsule, Take 50,000 Units by mouth once a week. (Patient not taking: Reported on 04/07/2022), Disp: , Rfl:   Cardiovascular  and other pertinent studies:  Echocardiogram 01/19/2022: Normal LV systolic function with visual EF 60-65%. Left ventricle cavity is normal in size. Normal left ventricular wall thickness. Normal global wall motion. Normal diastolic filling pattern, normal LAP. Aortic valve sclerosis without stenosis. Mild to moderate mitral regurgitation. Compared to 03/23/2020 G1DD is now normal, mild MR is now mild/moderate, otherwise no significant change. I personally reviewed the echocardiogram.  LV cavity is not dilated, and is normal.  EKG 01/06/2022: Sinus rhythm 63 bpm Occasional PAC   Left atrial enlargement Nonspecific T wave abnormality  CT Angio Neck 04/05/2019: Occlusion of the cervical left ICA. At least partial reconstitution intracranially at the level of the proximal cavernous segment. Remainder of intracranial portion is not included. Plaque at the right ICA origin with approximately 50% stenosis. Nonspecific left maxillary sinus air-fluid level, which can reflect acute sinusitis in the appropriate clinical setting.   ABI 02/19/2019:  This exam reveals normal perfusion of the right lower extremity (ABI 1.00). This exam reveals normal perfusion of the left lower extremity (ABI 1.00).   Lexiscan Sestamibi Stress Test 02/18/2019: Nondiagnostic ECG stress due to pharmacologic stress. oft tissue attenuation in the anterior wall. Normal myocardial perfusion. without ischemia or scar All segments of left ventricle demonstrated normal wall motion and thickening. Stress LV EF is normal 57%.  No previous exam available for comparison. Low risk study.  CT head 01/2019: Possible left frontal lobe acute infarct   Recent labs: 12/18/2021: Glucose 99, BUN/Cr 32/1.4. EGFR 58. Na/K 140/5.3. Rest of the CMP normal H/H 14/44. MCV 91. Platelets 220 HbA1C 5.9% Chol 68, TG 70, HDL 33, LDL 9   12/15/2020: Glucose 99, BUN/Cr 27/1.49. EGFR 54. Na/K 144/5.3. Rest of the CMP normal H/H 15/46.  MCV 92. Platelets 224 HbA1C 6.0% Chol 143, TG 99, HDL 40, LDL 84 TSH 1.1 normal  03/17/2019: Glucose 106, BUN/Cr 27/1.24. EGFR 64. Na/K 142/4.6. Rest of the CMP normal Chol 153, TG 121, HDL 41, LDL 90  07/18/2019: BNP 9 normal  01/21/2019: Glucose 107, BUN/Cr 21/1.1. eGFR >60. Na/K 139/4.4. Rest of the CMP normal. H/H 15.7/46.8. Platelets 162    Review of Systems  Cardiovascular:  Positive for dyspnea on exertion (Improved) and leg swelling. Negative for chest pain, palpitations and syncope.  Musculoskeletal:  Positive for back pain.         Vitals:   04/07/22 0826  BP: (!) 142/57  Pulse: (!) 54  Resp: 16  SpO2: 97%     Body mass index is 44.4 kg/m. Filed Weights   04/07/22 0826  Weight: 292 lb (132.5 kg)     Objective:   Physical Exam Vitals and nursing note reviewed.  Constitutional:      Appearance: He is well-developed.  Neck:     Vascular: No JVD.  Cardiovascular:     Rate and Rhythm: Normal rate and regular rhythm.     Pulses:          Carotid pulses are  on the right side with bruit and  on the left side with bruit.      Dorsalis pedis pulses are 1+ on the right side and 1+ on the left side.       Posterior tibial pulses are 0 on the right side and 0 on the left side.     Heart sounds: Murmur heard.     Harsh midsystolic murmur is present with a grade of 2/6 at the upper right sternal border radiating to the neck.  Pulmonary:     Effort: Pulmonary effort is normal.     Breath sounds: Normal breath sounds. No wheezing or rales.  Musculoskeletal:     Right lower leg: Edema (1+) present.     Left lower leg: Edema (1+) present.         Assessment & Recommendations:   62 y.o. male with hypertension, former smoker, stroke (01/2019), OSA not on CPAP  Hypertension: Improved. Continue amlodipine 5 mg 2 pills daily  (patient prefers this rather than 10 mg pill ), hydralazine 50 mg tid, losartan-HCTZ 100-25 mg daily, metoprolol succinate 100 mg  daily. Continue CPAP for OSA.  Exertional dyspnea: He has mild leg edema could be due to amlodipine and hydralazine.  Echocardiogram in 01/2022 does not show overt diastolic dysfunction.  I will check proBNP and chest x-ray to evaluate for etiology of his exertional dyspnea.  If both do not suggest congestive heart failure, I wonder if his dyspnea is due to COPD.  He has upcoming follow-up with his pulmonologist.  H/o stroke: Known occluded left carotid.   Moderate right carotid stenosis.   Continue risk factor modification.   Lipids very well controlled Continue aspirin 81 mg daily, Crestor 40 mg daily.  Murmur: Aortic sclerosis, mid to mod MR. Clinically non-significant.  F/u in 3 months    Nigel Mormon, MD Pager: [phone] Office: [phone]

## 2022-05-13 ENCOUNTER — Encounter: Payer: Self-pay | Admitting: Cardiology

## 2022-05-16 ENCOUNTER — Other Ambulatory Visit: Payer: Self-pay | Admitting: Pulmonary Disease

## 2022-05-16 DIAGNOSIS — R942 Abnormal results of pulmonary function studies: Secondary | ICD-10-CM

## 2022-05-26 ENCOUNTER — Ambulatory Visit
Admission: RE | Admit: 2022-05-26 | Discharge: 2022-05-26 | Disposition: A | Discharge status: Home / Self Care | Payer: BC Managed Care – PPO | Source: Ambulatory Visit | Attending: Cardiology | Admitting: Cardiology

## 2022-05-26 DIAGNOSIS — R0609 Other forms of dyspnea: Secondary | ICD-10-CM

## 2022-05-26 DIAGNOSIS — R0602 Shortness of breath: Secondary | ICD-10-CM | POA: Diagnosis not present

## 2022-06-06 ENCOUNTER — Ambulatory Visit: Payer: BC Managed Care – PPO | Admitting: Pulmonary Disease

## 2022-06-06 ENCOUNTER — Encounter: Payer: Self-pay | Admitting: Pulmonary Disease

## 2022-06-06 VITALS — BP 130/70 | HR 64 | Ht 68.0 in | Wt 287.6 lb

## 2022-06-06 DIAGNOSIS — G4733 Obstructive sleep apnea (adult) (pediatric): Secondary | ICD-10-CM | POA: Diagnosis not present

## 2022-06-06 DIAGNOSIS — Z87891 Personal history of nicotine dependence: Secondary | ICD-10-CM | POA: Diagnosis not present

## 2022-06-06 DIAGNOSIS — R942 Abnormal results of pulmonary function studies: Secondary | ICD-10-CM

## 2022-06-06 MED ORDER — BREZTRI AEROSPHERE 160-9-4.8 MCG/ACT IN AERO: 2.0000 | INHALATION_SPRAY | Freq: Two times a day (BID) | RESPIRATORY_TRACT | 0 refills | Status: DC

## 2022-06-06 NOTE — Progress Notes (Signed)
Synopsis: Referred in February 2023 for COPD by Shelbie Proctor, NP  Subjective:   PATIENT ID: Francisco Gross GENDER: male DOB: 12-Mar-1960, MRN: [id number]  HPI  Chief Complaint  Patient presents with   Follow-up    13yr f/u. States his SOB has been increasing since December 2023. Still using the albuterol and Stiolto but they are not working.    [name] is a 62 year old male, former smoker with history of CKD, HLD, CVA and sleep apnea who returns to pulmonary clinic for COPD.   He started CPAP in 02/2022. His blood pressure has improved a bit. He is not using it every night. The stiolto does not seem to help with his exertional dyspnea. He denies cough or wheezing. He is having to take a break after getting a shower. He is not doing any organized exercise. His weight has been stable with BMI 43.  OV 05/04/22 He was provided trial of stiolto at last visit with improvement in his breathing. He has lost 8lbs since last visit as he cut soda out of his diet.   PFTs today show non-specific pulmonary function pattern with air trapping and normal DLCO.   OV 03/10/21 He reports shortness of breath over the past 3 years that has been progressive. He denies cough or wheezing. He has gained 60lbs in the last 3 years. He drinks 20-40oz of mountain dew daily. He had a stroke in 2020 and quit smoking at that time. He has a 40+ pack year smoking history.   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   He has CPAP machine at home but does not use it regularly due to issues with mask fit and having it come off at various times during the night.   He had secondhand smoke exposure. His father died of lung cancer. Mother had bone cancer and a sister had breast cancer.    Past Medical History:  Diagnosis Date   Chronic kidney disease    kidney stones    Hyperlipidemia    Hypertension     Stroke [facility])      Family History  Problem Relation Age of Onset   Breast cancer Mother    Lung cancer Father    Breast cancer Sister    Colon cancer Neg Hx    Rectal cancer Neg Hx    Stomach cancer Neg Hx      Social History   Socioeconomic History   Marital status: Married    Spouse name: Not on file   Number of children: 2   Years of education: Not on file   Highest education level: Not on file  Occupational History   Not on file  Tobacco Use   Smoking status: Former    Packs/day: 1.50    Years: 40.00    Additional pack years: 0.00    Total pack years: 60.00    Types: Cigarettes    Start date: 08/09/1975    Quit date: 01/18/2019    Years since quitting: 3.3   Smokeless tobacco: Never  Vaping Use   Vaping Use: Never used  Substance and Sexual Activity   Alcohol use: Not Currently    Alcohol/week: 0.0 standard drinks of alcohol   Drug use: No   Sexual activity: Not on file  Other Topics Concern   Not on file  Social History Narrative   Right handed  One story home   Drinks one cup of coffe daily   Social Determinants of Health   Financial Resource Strain: Not on file  Food Insecurity: Not on file  Transportation Needs: Not on file  Physical Activity: Not on file  Stress: Not on file  Social Connections: Not on file  Intimate Partner Violence: Not on file     No Known Allergies   Outpatient Medications Prior to Visit  Medication Sig Dispense Refill   aspirin EC 81 MG tablet Take 1 tablet (81 mg total) by mouth daily. 90 tablet 3   hydrALAZINE (APRESOLINE) 50 MG tablet Take 1 tablet (50 mg total) by mouth 3 (three) times daily. 90 tablet 3   losartan-hydrochlorothiazide (HYZAAR) 100-12.5 MG tablet Take 1 tablet by mouth daily.     metoprolol succinate (TOPROL-XL) 100 MG 24 hr tablet Take 100 mg by mouth daily.     REPATHA SURECLICK 140 MG/ML SOAJ SMARTSIG:140 Milligram(s) SUB-Q Every 2 Weeks     rosuvastatin (CRESTOR) 20 MG tablet Take 20 mg by mouth  at bedtime.     Tiotropium Bromide-Olodaterol (STIOLTO RESPIMAT) 2.5-2.5 MCG/ACT AERS INHALE 2 PUFFS INTO THE LUNGS DAILY 4 g 0   amLODipine (NORVASC) 5 MG tablet Take 1 tablet (5 mg total) by mouth every evening. (Patient taking differently: Take 10 mg by mouth every evening.) 90 tablet 3   omega-3 acid ethyl esters (LOVAZA) 1 g capsule Take 2 capsules by mouth 2 (two) times daily. (Patient not taking: Reported on 04/07/2022)     Vitamin D, Ergocalciferol, (DRISDOL) 1.25 MG (50000 UNIT) CAPS capsule Take 50,000 Units by mouth once a week. (Patient not taking: Reported on 04/07/2022)     No facility-administered medications prior to visit.    Review of Systems  Constitutional:  Negative for chills, fever, malaise/fatigue and weight loss.  HENT:  Negative for congestion, sinus pain and sore throat.   Eyes: Negative.   Respiratory:  Positive for shortness of breath. Negative for cough, hemoptysis, sputum production and wheezing.   Cardiovascular:  Negative for chest pain, palpitations, orthopnea, claudication and leg swelling.  Gastrointestinal:  Negative for abdominal pain, heartburn, nausea and vomiting.  Genitourinary: Negative.   Musculoskeletal:  Negative for joint pain and myalgias.  Skin:  Negative for rash.  Neurological:  Negative for weakness.  Endo/Heme/Allergies: Negative.   Psychiatric/Behavioral: Negative.     Objective:   Vitals:   06/06/22 1415  BP: 130/70  Pulse: 64  SpO2: 96%  Weight: 287 lb 9.6 oz (130.5 kg)  Height: 5\' 8"  (1.727 m)   Physical Exam Constitutional:      General: He is not in acute distress.    Appearance: He is obese.  HENT:     Head: Normocephalic and atraumatic.  Eyes:     Conjunctiva/sclera: Conjunctivae normal.  Cardiovascular:     Rate and Rhythm: Normal rate and regular rhythm.     Pulses: Normal pulses.     Heart sounds: Normal heart sounds. No murmur heard. Pulmonary:     Effort: Pulmonary effort is normal.     Breath sounds:  Normal breath sounds. No wheezing, rhonchi or rales.  Musculoskeletal:     Right lower leg: No edema.     Left lower leg: No edema.  Skin:    General: Skin is warm and dry.  Neurological:     General: No focal deficit present.     Mental Status: He is alert.    CBC No results found for: "WBC", "  RBC", "HGB", "HCT", "PLT", "MCV", "MCH", "MCHC", "RDW", "LYMPHSABS", "MONOABS", "EOSABS", "BASOSABS"  Chest imaging: CXR 05/26/22 The heart size and mediastinal contours are within normal limits. Both lungs are clear. The visualized skeletal structures are stable.  PFT:    Latest Ref Rng & Units 05/03/2021    2:46 PM  PFT Results  FVC-Pre L 2.71   FVC-Predicted Pre % 60   FVC-Post L 2.85   FVC-Predicted Post % 63   Pre FEV1/FVC % % 78   Post FEV1/FCV % % 77   FEV1-Pre L 2.13   FEV1-Predicted Pre % 62   FEV1-Post L 2.20   DLCO uncorrected ml/min/mmHg 26.75   DLCO UNC% % 102   DLCO corrected ml/min/mmHg 26.75   DLCO COR %Predicted % 102   DLVA Predicted % 125   TLC L 6.01   TLC % Predicted % 91   RV % Predicted % 135   PFTs: non-specific pulmonary function with air trapping present and normal DLCO  Labs:  Path:  Echo 2023: LV EF 60-65%. Normal diastolic pattern. Mild to moderate mitral regurgitation.   Heart Catheterization:  Assessment & Plan:   Abnormal PFT  Obstructive sleep apnea syndrome  Former smoker  Discussion: Yvan Dority is a 62 year old male, former smoker with history of CKD, HLD, CVA and sleep apnea who returns to pulmonary clinic for COPD.   Based on his non-specific PFT pattern with signs of air-trapping there is concern for COPD. He noted relief with LAMA/LABA therapy  previously but odes not feel stiolto is giving him much relief anymore. He is to try breztri inhaler 2 puffs twice daily and let us know if he would like to continue this inhaler.   He has been referred to our lung cancer screening program.  Encouraged further weight reduction and  starting a consistent exercise regimen.  Follow up in 1 year.  Melody Comas, MD [facility] Office: [phone]   Current Outpatient Medications:    aspirin EC 81 MG tablet, Take 1 tablet (81 mg total) by mouth daily., Disp: 90 tablet, Rfl: 3   Budeson-Glycopyrrol-Formoterol (BREZTRI AEROSPHERE) 160-9-4.8 MCG/ACT AERO, Inhale 2 puffs into the lungs in the morning and at bedtime., Disp: 2 each, Rfl: 0   hydrALAZINE (APRESOLINE) 50 MG tablet, Take 1 tablet (50 mg total) by mouth 3 (three) times daily., Disp: 90 tablet, Rfl: 3   losartan-hydrochlorothiazide (HYZAAR) 100-12.5 MG tablet, Take 1 tablet by mouth daily., Disp: , Rfl:    metoprolol succinate (TOPROL-XL) 100 MG 24 hr tablet, Take 100 mg by mouth daily., Disp: , Rfl:    REPATHA SURECLICK 140 MG/ML SOAJ, SMARTSIG:140 Milligram(s) SUB-Q Every 2 Weeks, Disp: , Rfl:    rosuvastatin (CRESTOR) 20 MG tablet, Take 20 mg by mouth at bedtime., Disp: , Rfl:    Tiotropium Bromide-Olodaterol (STIOLTO RESPIMAT) 2.5-2.5 MCG/ACT AERS, INHALE 2 PUFFS INTO THE LUNGS DAILY, Disp: 4 g, Rfl: 0   amLODipine (NORVASC) 5 MG tablet, Take 1 tablet (5 mg total) by mouth every evening. (Patient taking differently: Take 10 mg by mouth every evening.), Disp: 90 tablet, Rfl: 3

## 2022-06-06 NOTE — Patient Instructions (Addendum)
Try breztri 2 puffs twice daily - rinse mouth out after each use  Stop stiolto while using breztri  If you like the breztri inhaler, let us know and we will send in prescription  Recommend working on 20-30 minutes of continuous exercise with a stationary bike or walking  Follow up in 1 year

## 2022-06-09 ENCOUNTER — Other Ambulatory Visit: Payer: Self-pay | Admitting: *Deleted

## 2022-06-09 DIAGNOSIS — Z122 Encounter for screening for malignant neoplasm of respiratory organs: Secondary | ICD-10-CM

## 2022-06-09 DIAGNOSIS — Z87891 Personal history of nicotine dependence: Secondary | ICD-10-CM

## 2022-06-13 ENCOUNTER — Ambulatory Visit: Payer: BC Managed Care – PPO | Admitting: Cardiology

## 2022-06-13 ENCOUNTER — Telehealth: Payer: Self-pay

## 2022-06-13 ENCOUNTER — Other Ambulatory Visit: Payer: Self-pay | Admitting: Pulmonary Disease

## 2022-06-13 DIAGNOSIS — I10 Essential (primary) hypertension: Secondary | ICD-10-CM | POA: Diagnosis not present

## 2022-06-13 DIAGNOSIS — E875 Hyperkalemia: Secondary | ICD-10-CM

## 2022-06-13 DIAGNOSIS — R942 Abnormal results of pulmonary function studies: Secondary | ICD-10-CM

## 2022-06-13 NOTE — Progress Notes (Signed)
Labs 06/10/2022: BUN/creatinine 46/1.54.  eGFR 51. NA/K 140/6.1.  Potassium is too high. Please tell the patient to go to nearest urgent care or come to our office for stat EKG. He will also need stat repeat BMP labs. Hold losartan-HCTZ today.

## 2022-06-13 NOTE — Telephone Encounter (Signed)
PCP going to bring him in for f/u labs tomorrow. Stopped losartan as well. She just wanted to keep [place] updated.  [employer] Call back number- [phone]

## 2022-06-13 NOTE — Progress Notes (Signed)
Personally reviewed EKG performed earlier today. There are hyperacute T waves.  I personally spoke with the patient.  His PCP has called in Kayexalate with plans to repeat BMP on Wednesday, 06/15/2022.  His losartan hydrochlorothiazide is being held at this time.  I agree with the above recommendations.   Elder Negus, MD Pager: [phone] Office: [phone]'

## 2022-06-15 ENCOUNTER — Telehealth: Payer: Self-pay | Admitting: Pulmonary Disease

## 2022-06-15 NOTE — Telephone Encounter (Signed)
Patient states needs RX for [employer] inhaler. Pharmacy is [facility]. Patient phone number is [phone].

## 2022-06-16 ENCOUNTER — Ambulatory Visit: Payer: BC Managed Care – PPO | Admitting: Cardiology

## 2022-06-16 MED ORDER — BREZTRI AEROSPHERE 160-9-4.8 MCG/ACT IN AERO: 2.0000 | INHALATION_SPRAY | Freq: Two times a day (BID) | RESPIRATORY_TRACT | 6 refills | Status: DC

## 2022-06-16 NOTE — Telephone Encounter (Signed)
Rx for Francisco Gross has been sent to pharmacy for pt. Called and spoke with pt letting him know this has been done and he verbalized understanding. Nothing further needed.

## 2022-07-05 ENCOUNTER — Ambulatory Visit (INDEPENDENT_AMBULATORY_CARE_PROVIDER_SITE_OTHER): Payer: BC Managed Care – PPO | Admitting: Acute Care

## 2022-07-05 ENCOUNTER — Encounter: Payer: Self-pay | Admitting: Acute Care

## 2022-07-05 DIAGNOSIS — Z87891 Personal history of nicotine dependence: Secondary | ICD-10-CM

## 2022-07-05 NOTE — Patient Instructions (Signed)
Thank you for participating in the [facility] Scre[facility]. It was our pleasure to meet you today. We will call you with the results of your scan within the next few days. Your scan will be assigned a Lung RADS category score by the physicians reading the scans.  This Lung RADS score determines follow up scanning.  See below for description of categories, and follow up screening recommendations. We will be in touch to schedule your follow up screening annually or based on recommendations of our providers. We will fax a copy of your scan results to your Primary Care Physician, or the physician who referred you to the program, to ensure they have the results. Please call the office if you have any questions or concerns regarding your scanning experience or results.  Our office number is [phone]. Please speak with Denise Phelps, RN. , or  Denise Buckner RN, They are  our Lung Cancer Screening RN.'s If They are unavailable when you call, Please leave a message on the voice mail. We will return your call at our earliest convenience.This voice mail is monitored several times a day.  Remember, if your scan is normal, we will scan you annually as long as you continue to meet the criteria for the program. (Age 50-80, Current smoker or smoker who has quit within the last 15 years). If you are a smoker, remember, quitting is the single most powerful action that you can take to decrease your risk of lung cancer and other pulmonary, breathing related problems. We know quitting is hard, and we are here to help.  Please let us know if there is anything we can do to help you meet your goal of quitting. If you are a former smoker, congratulations. We are proud of you! Remain smoke free! Remember you can refer friends or family members through the number above.  We will screen them to make sure they meet criteria for the program. Thank you for helping us take better care of you by  participating in Lung Screening.  You can receive free nicotine replacement therapy ( patches, gum or mints) by calling 1-800-QUIT NOW. Please call so we can get you on the path to becoming  a non-smoker. I know it is hard, but you can do this!  Lung RADS Categories:  Lung RADS 1: no nodules or definitely non-concerning nodules.  Recommendation is for a repeat annual scan in 12 months.  Lung RADS 2:  nodules that are non-concerning in appearance and behavior with a very low likelihood of becoming an active cancer. Recommendation is for a repeat annual scan in 12 months.  Lung RADS 3: nodules that are probably non-concerning , includes nodules with a low likelihood of becoming an active cancer.  Recommendation is for a 6-month repeat screening scan. Often noted after an upper respiratory illness. We will be in touch to make sure you have no questions, and to schedule your 6-month scan.  Lung RADS 4 A: nodules with concerning findings, recommendation is most often for a follow up scan in 3 months or additional testing based on our provider's assessment of the scan. We will be in touch to make sure you have no questions and to schedule the recommended 3 month follow up scan.  Lung RADS 4 B:  indicates findings that are concerning. We will be in touch with you to schedule additional diagnostic testing based on our provider's  assessment of the scan.  Other options for assistance in smoking cessation (   As covered by your insurance benefits)  Hypnosis for smoking cessation  [employer] [phone]  Acupuncture for smoking cessation  [facility] [phone]   

## 2022-07-05 NOTE — Progress Notes (Signed)
Virtual Visit via Telephone Note  I connected with Francisco Gross on 07/05/22 at  4:00 PM EDT by telephone and verified that I am speaking with the correct person using two identifiers.  Location: Patient:  At home Provider: [address], [place], [place], [address]    I discussed the limitations, risks, security and privacy concerns of performing an evaluation and management service by telephone and the availability of in person appointments. I also discussed with the patient that there may be a patient responsible charge related to this service. The patient expressed understanding and agreed to proceed.   Shared Decision Making Visit Lung Cancer Screening Program [phone])   Eligibility: Age 62 y.o. Pack Years Smoking History Calculation 80 pack year smoking history (# packs/per year x # years smoked) Recent History of coughing up blood  no Unexplained weight loss? no ( >Than 15 pounds within the last 6 months ) Prior History Lung / other cancer no (Diagnosis within the last 5 years already requiring surveillance chest CT Scans). Smoking Status Former Smoker Former Smokers: Years since quit: 3 years  Quit Date: 01/18/2019  Visit Components: Discussion included one or more decision making aids. yes Discussion included risk/benefits of screening. yes Discussion included potential follow up diagnostic testing for abnormal scans. yes Discussion included meaning and risk of over diagnosis. yes Discussion included meaning and risk of False Positives. yes Discussion included meaning of total radiation exposure. yes  Counseling Included: Importance of adherence to annual lung cancer LDCT screening. yes Impact of comorbidities on ability to participate in the program. yes Ability and willingness to under diagnostic treatment. yes  Smoking Cessation Counseling: Current Smokers:  Discussed importance of smoking cessation. yes Information about tobacco cessation classes and  interventions provided to patient. yes Patient provided with "ticket" for LDCT Scan. yes Symptomatic Patient. no  Counseling NA Diagnosis Code: Tobacco Use Z72.0 Asymptomatic Patient yes  Counseling (Intermediate counseling: > three minutes counseling) U0454 Former Smokers:  Discussed the importance of maintaining cigarette abstinence. yes Diagnosis Code: Personal History of Nicotine Dependence. U98.119 Information about tobacco cessation classes and interventions provided to patient. Yes Patient provided with "ticket" for LDCT Scan. yes Written Order for Lung Cancer Screening with LDCT placed in Epic. Yes (CT Chest Lung Cancer Screening Low Dose W/O CM) [id number] Z12.2-Screening of respiratory organs Z87.891-Personal history of nicotine dependence  I spent 25 minutes of face to face time/virtual visit time  with  Francisco Gross discussing the risks and benefits of lung cancer screening. We took the time to pause the power point at intervals to allow for questions to be asked and answered to ensure understanding. We discussed that he had taken the single most powerful action possible to decrease his risk of developing lung cancer when he quit smoking. I counseled him to remain smoke free, and to contact me if he ever had the desire to smoke again so that I can provide resources and tools to help support the effort to remain smoke free. We discussed the time and location of the scan, and that either  Abigail Miyamoto RN, Karlton Lemon, RN or I  or I will call / send a letter with the results within  24-72 hours of receiving them. He has the office contact information in the event he needs to speak with me,  he verbalized understanding of all of the above and had no further questions upon leaving the office.     I explained to the patient that there has  been a high incidence of coronary artery disease noted on these exams. I explained that this is a non-gated exam therefore degree or severity cannot be  determined. This patient is on statin therapy. I have asked the patient to follow-up with their PCP regarding any incidental finding of coronary artery disease and management with diet or medication as they feel is clinically indicated. The patient verbalized understanding of the above and had no further questions.     Bevelyn Ngo, NP 07/05/2022

## 2022-07-06 ENCOUNTER — Ambulatory Visit: Payer: BC Managed Care – PPO | Admitting: Cardiology

## 2022-07-07 ENCOUNTER — Ambulatory Visit
Admission: RE | Admit: 2022-07-07 | Discharge: 2022-07-07 | Disposition: A | Discharge status: Home / Self Care | Payer: BC Managed Care – PPO | Source: Ambulatory Visit | Attending: Acute Care | Admitting: Acute Care

## 2022-07-07 DIAGNOSIS — Z87891 Personal history of nicotine dependence: Secondary | ICD-10-CM

## 2022-07-07 DIAGNOSIS — Z122 Encounter for screening for malignant neoplasm of respiratory organs: Secondary | ICD-10-CM

## 2022-07-08 ENCOUNTER — Ambulatory Visit: Payer: BC Managed Care – PPO | Admitting: Internal Medicine

## 2022-07-08 ENCOUNTER — Encounter: Payer: Self-pay | Admitting: Internal Medicine

## 2022-07-08 VITALS — BP 148/58 | HR 56 | Resp 14 | Ht 68.0 in | Wt 282.0 lb

## 2022-07-08 DIAGNOSIS — R011 Cardiac murmur, unspecified: Secondary | ICD-10-CM

## 2022-07-08 DIAGNOSIS — I10 Essential (primary) hypertension: Secondary | ICD-10-CM

## 2022-07-08 DIAGNOSIS — Z8673 Personal history of transient ischemic attack (TIA), and cerebral infarction without residual deficits: Secondary | ICD-10-CM | POA: Diagnosis not present

## 2022-07-08 NOTE — Progress Notes (Unsigned)
Patient referred by Darrin Nipper, MD for cardiac risk stratification  Subjective:   Francisco Gross, male    DOB: 1961-01-11, 62 y.o.   MRN: [id number]   No chief complaint on file.   62 y.o. male with hypertension, former smoker, stroke (01/2019)  Patient started OSA for his CPAP for last 4 weeks.  Blood pressure has improved.  However, he continues to have dyspnea on minimal exertion.  He still has leg edema, that said he takes amlodipine 10 mg daily.  He is a former smoker and known to have mild COPD in the past.  He is scheduled to see a pulmonologist soon.  Current Outpatient Medications:    aspirin EC 81 MG tablet, Take 1 tablet (81 mg total) by mouth daily., Disp: 90 tablet, Rfl: 3   Budeson-Glycopyrrol-Formoterol (BREZTRI AEROSPHERE) 160-9-4.8 MCG/ACT AERO, Inhale 2 puffs into the lungs in the morning and at bedtime., Disp: 10.7 g, Rfl: 6   hydrALAZINE (APRESOLINE) 50 MG tablet, Take 1 tablet (50 mg total) by mouth 3 (three) times daily., Disp: 90 tablet, Rfl: 3   losartan-hydrochlorothiazide (HYZAAR) 100-12.5 MG tablet, Take 1 tablet by mouth daily., Disp: , Rfl:    metoprolol succinate (TOPROL-XL) 100 MG 24 hr tablet, Take 100 mg by mouth daily., Disp: , Rfl:    REPATHA SURECLICK 140 MG/ML SOAJ, SMARTSIG:140 Milligram(s) SUB-Q Every 2 Weeks, Disp: , Rfl:    rosuvastatin (CRESTOR) 20 MG tablet, Take 20 mg by mouth at bedtime., Disp: , Rfl:    amLODipine (NORVASC) 5 MG tablet, Take 1 tablet (5 mg total) by mouth every evening. (Patient taking differently: Take 10 mg by mouth every evening.), Disp: 90 tablet, Rfl: 3  Cardiovascular and other pertinent studies:  Echocardiogram 01/19/2022: Normal LV systolic function with visual EF 60-65%. Left ventricle cavity is normal in size. Normal left ventricular wall thickness. Normal global wall motion. Normal diastolic filling pattern, normal LAP. Aortic valve sclerosis without stenosis. Mild to moderate mitral  regurgitation. Compared to 03/23/2020 G1DD is now normal, mild MR is now mild/moderate, otherwise no significant change. I personally reviewed the echocardiogram.  LV cavity is not dilated, and is normal.  EKG 01/06/2022: Sinus rhythm 63 bpm Occasional PAC   Left atrial enlargement Nonspecific T wave abnormality  CT Angio Neck 04/05/2019: Occlusion of the cervical left ICA. At least partial reconstitution intracranially at the level of the proximal cavernous segment. Remainder of intracranial portion is not included. Plaque at the right ICA origin with approximately 50% stenosis. Nonspecific left maxillary sinus air-fluid level, which can reflect acute sinusitis in the appropriate clinical setting.   ABI 02/19/2019:  This exam reveals normal perfusion of the right lower extremity (ABI 1.00). This exam reveals normal perfusion of the left lower extremity (ABI 1.00).   Lexiscan Sestamibi Stress Test 02/18/2019: Nondiagnostic ECG stress due to pharmacologic stress. oft tissue attenuation in the anterior wall. Normal myocardial perfusion. without ischemia or scar All segments of left ventricle demonstrated normal wall motion and thickening. Stress LV EF is normal 57%.  No previous exam available for comparison. Low risk study.   CT head 01/2019: Possible left frontal lobe acute infarct   Recent labs: 12/18/2021: Glucose 99, BUN/Cr 32/1.4. EGFR 58. Na/K 140/5.3. Rest of the CMP normal H/H 14/44. MCV 91. Platelets 220 HbA1C 5.9% Chol 68, TG 70, HDL 33, LDL 9   12/15/2020: Glucose 99, BUN/Cr 27/1.49. EGFR 54. Na/K 144/5.3. Rest of the CMP normal H/H 15/46. MCV 92. Platelets 224 HbA1C 6.0%  Chol 143, TG 99, HDL 40, LDL 84 TSH 1.1 normal  03/17/2019: Glucose 106, BUN/Cr 27/1.24. EGFR 64. Na/K 142/4.6. Rest of the CMP normal Chol 153, TG 121, HDL 41, LDL 90  07/18/2019: BNP 9 normal  01/21/2019: Glucose 107, BUN/Cr 21/1.1. eGFR >60. Na/K 139/4.4. Rest of the CMP normal. H/H  15.7/46.8. Platelets 162    Review of Systems  Cardiovascular:  Positive for dyspnea on exertion (Improved) and leg swelling. Negative for chest pain, palpitations and syncope.  Musculoskeletal:  Positive for back pain.         Vitals:   07/08/22 1004  BP: (!) 148/58  Pulse: (!) 56  Resp: 14  SpO2: 94%     Body mass index is 42.88 kg/m. Filed Weights   07/08/22 1004  Weight: 282 lb (127.9 kg)     Objective:   Physical Exam Vitals and nursing note reviewed.  Constitutional:      Appearance: He is well-developed.  Neck:     Vascular: No JVD.  Cardiovascular:     Rate and Rhythm: Normal rate and regular rhythm.     Pulses:          Carotid pulses are  on the right side with bruit and  on the left side with bruit.      Dorsalis pedis pulses are 1+ on the right side and 1+ on the left side.       Posterior tibial pulses are 0 on the right side and 0 on the left side.     Heart sounds: Murmur heard.     Harsh midsystolic murmur is present with a grade of 2/6 at the upper right sternal border radiating to the neck.  Pulmonary:     Effort: Pulmonary effort is normal.     Breath sounds: Normal breath sounds. No wheezing or rales.  Musculoskeletal:     Right lower leg: Edema (1+) present.     Left lower leg: Edema (1+) present.         Assessment & Recommendations:   62 y.o. male with hypertension, former smoker, stroke (01/2019), OSA not on CPAP  Hypertension: Improved. Continue amlodipine 5 mg 2 pills daily  (patient prefers this rather than 10 mg pill ), hydralazine 50 mg tid, losartan-HCTZ 100-25 mg daily, metoprolol succinate 100 mg daily. Continue CPAP for OSA.  Exertional dyspnea: He has mild leg edema could be due to amlodipine and hydralazine.  Echocardiogram in 01/2022 does not show overt diastolic dysfunction.  I will check proBNP and chest x-ray to evaluate for etiology of his exertional dyspnea.  If both do not suggest congestive heart failure, I  wonder if his dyspnea is due to COPD.  He has upcoming follow-up with his pulmonologist.  H/o stroke: Known occluded left carotid.   Moderate right carotid stenosis.   Continue risk factor modification.   Lipids very well controlled Continue aspirin 81 mg daily, Crestor 40 mg daily.  Murmur: Aortic sclerosis, mid to mod MR. Clinically non-significant.  F/u in 3 months    Clotilde Dieter, DO Pager: [phone] Office: [phone]

## 2022-07-11 ENCOUNTER — Other Ambulatory Visit: Payer: Self-pay

## 2022-07-11 DIAGNOSIS — M79604 Pain in right leg: Secondary | ICD-10-CM

## 2022-07-12 ENCOUNTER — Other Ambulatory Visit: Payer: Self-pay | Admitting: Acute Care

## 2022-07-12 DIAGNOSIS — Z87891 Personal history of nicotine dependence: Secondary | ICD-10-CM

## 2022-07-12 DIAGNOSIS — Z122 Encounter for screening for malignant neoplasm of respiratory organs: Secondary | ICD-10-CM

## 2022-07-12 DIAGNOSIS — F17210 Nicotine dependence, cigarettes, uncomplicated: Secondary | ICD-10-CM

## 2022-07-22 ENCOUNTER — Ambulatory Visit (HOSPITAL_COMMUNITY)
Admission: RE | Admit: 2022-07-22 | Discharge: 2022-07-22 | Disposition: A | Discharge status: Home / Self Care | Payer: BC Managed Care – PPO | Source: Ambulatory Visit | Attending: Vascular Surgery | Admitting: Vascular Surgery

## 2022-07-22 DIAGNOSIS — M79605 Pain in left leg: Secondary | ICD-10-CM | POA: Diagnosis not present

## 2022-07-22 DIAGNOSIS — M79604 Pain in right leg: Secondary | ICD-10-CM | POA: Insufficient documentation

## 2022-07-22 LAB — VAS US ABI WITH/WO TBI
Left ABI: 1.15
Right ABI: 1.03

## 2022-07-26 ENCOUNTER — Ambulatory Visit: Payer: BC Managed Care – PPO | Admitting: Vascular Surgery

## 2022-07-26 ENCOUNTER — Encounter: Payer: Self-pay | Admitting: Vascular Surgery

## 2022-07-26 VITALS — BP 135/66 | HR 68 | Temp 98.5°F | Wt 283.0 lb

## 2022-07-26 DIAGNOSIS — M79605 Pain in left leg: Secondary | ICD-10-CM | POA: Diagnosis not present

## 2022-07-26 DIAGNOSIS — M79604 Pain in right leg: Secondary | ICD-10-CM

## 2022-07-26 NOTE — Progress Notes (Signed)
[facility] [place]  ASSESSMENT / PLAN: Francisco Gross is a 62 y.o. male with lateral lower extremity discomfort.  Clinical exam and noninvasive testing today shows no evidence of significant peripheral arterial disease.  His history is more consistent with spinal stenosis or foraminal stenosis or sciatica.  Encouraged him to follow-up with his primary care physician.  He can follow-up with me on an as-needed basis  CHIEF COMPLAINT: Lower extremity pain  HISTORY OF PRESENT ILLNESS: Francisco Gross is a 62 y.o. male who presents to clinic for evaluation of possible peripheral arterial disease.  The patient reports he has radiating discomfort from his buttock down his leg which will occur when standing or walking for long period of time.  Stopping walking does not relieve his pain.  Changing from a standing to a seated position gives him quick relief of his pain.  He does not describe classic claudication symptoms.  He does not describe rest pain in his feet.  He has no ulcers about his feet.   Past Medical History:  Diagnosis Date   Chronic kidney disease    kidney stones    Hyperlipidemia    Hypertension    Stroke [facility])     Past Surgical History:  Procedure Laterality Date   basket retrieval of kidney stone     LITHOTRIPSY      Family History  Problem Relation Age of Onset   Breast cancer Mother    Lung cancer Father    Breast cancer Sister    Colon cancer Neg Hx    Rectal cancer Neg Hx    Stomach cancer Neg Hx     Social History   Socioeconomic History   Marital status: Married    Spouse name: Not on file   Number of children: 2   Years of education: Not on file   Highest education level: Not on file  Occupational History   Not on file  Tobacco Use   Smoking status: Former    Packs/day: 2.00    Years: 40.00    Additional pack years: 0.00    Total pack years: 80.00    Types: Cigarettes    Start date: 08/09/1975    Quit date: 01/18/2019     Years since quitting: 3.5   Smokeless tobacco: Never  Vaping Use   Vaping Use: Never used  Substance and Sexual Activity   Alcohol use: Not Currently    Alcohol/week: 0.0 standard drinks of alcohol   Drug use: No   Sexual activity: Not on file  Other Topics Concern   Not on file  Social History Narrative   Right handed   One story home   Drinks one cup of coffe daily   Social Determinants of Health   Financial Resource Strain: Not on file  Food Insecurity: Not on file  Transportation Needs: Not on file  Physical Activity: Not on file  Stress: Not on file  Social Connections: Not on file  Intimate Partner Violence: Not on file    No Known Allergies  Current Outpatient Medications  Medication Sig Dispense Refill   amLODipine (NORVASC) 5 MG tablet Take 1 tablet (5 mg total) by mouth every evening. (Patient taking differently: Take 10 mg by mouth every evening.) 90 tablet 3   aspirin EC 81 MG tablet Take 1 tablet (81 mg total) by mouth daily. 90 tablet 3   Budeson-Glycopyrrol-Formoterol (BREZTRI AEROSPHERE) 160-9-4.8 MCG/ACT AERO Inhale 2 puffs into the lungs in the morning  and at bedtime. 10.7 g 6   hydrALAZINE (APRESOLINE) 50 MG tablet Take 1 tablet (50 mg total) by mouth 3 (three) times daily. 90 tablet 3   losartan-hydrochlorothiazide (HYZAAR) 100-12.5 MG tablet Take 1 tablet by mouth daily.     metoprolol succinate (TOPROL-XL) 100 MG 24 hr tablet Take 100 mg by mouth daily.     REPATHA SURECLICK 140 MG/ML SOAJ SMARTSIG:140 Milligram(s) SUB-Q Every 2 Weeks     rosuvastatin (CRESTOR) 20 MG tablet Take 20 mg by mouth at bedtime.     No current facility-administered medications for this visit.    PHYSICAL EXAM Vitals:   07/26/22 1353  BP: 135/66  Pulse: 68  Temp: 98.5 F (36.9 C)  TempSrc: Temporal  SpO2: 94%  Weight: 283 lb (128.4 kg)    Middle-age man in no acute distress Regular rate and rhythm Unlabored breathing 2+ dorsalis pedis pulses  bilaterally  PERTINENT LABORATORY AND RADIOLOGIC DATA  +-------+-----------+-----------+------------+------------+  ABI/TBIToday's ABIToday's TBIPrevious ABIPrevious TBI  +-------+-----------+-----------+------------+------------+  Right 1.03       0.82       1.00                      +-------+-----------+-----------+------------+------------+  Left  1.15       0.91       1.00                      +-------+-----------+-----------+------------+------------+    Rande Brunt. Lenell Antu, MD FACS [facility] Phone Number: [phone] 07/26/2022 7:52 AM   Total time spent on preparing this encounter including chart review, data review, collecting history, examining the patient, coordinating care for this new patient, 45 minutes.  Portions of this report may have been transcribed using voice recognition software.  Every effort has been made to ensure accuracy; however, inadvertent computerized transcription errors may still be present.

## 2022-10-03 ENCOUNTER — Encounter: Payer: Self-pay | Admitting: Neurology

## 2022-10-05 ENCOUNTER — Encounter: Payer: Self-pay | Admitting: Cardiology

## 2022-10-06 ENCOUNTER — Ambulatory Visit: Payer: BC Managed Care – PPO | Admitting: Cardiology

## 2022-10-06 ENCOUNTER — Encounter: Payer: Self-pay | Admitting: Cardiology

## 2022-10-06 VITALS — BP 136/73 | HR 67 | Resp 16 | Ht 68.0 in | Wt 288.0 lb

## 2022-10-06 DIAGNOSIS — E782 Mixed hyperlipidemia: Secondary | ICD-10-CM | POA: Diagnosis not present

## 2022-10-06 DIAGNOSIS — I10 Essential (primary) hypertension: Secondary | ICD-10-CM | POA: Diagnosis not present

## 2022-10-06 NOTE — Progress Notes (Signed)
Patient referred by Darrin Nipper, MD for cardiac risk stratification  Subjective:   Francisco Gross, male    DOB: 1960-06-21, 62 y.o.   MRN: [id number]   Chief Complaint  Patient presents with   Shortness of Breath   Hypertension   Follow-up    3 month    62 y.o. male with hypertension, former smoker, stroke (01/2019)  Patient is doing well. He denies chest pain, palpitations, leg edema, orthopnea, PND, TIA/syncope. Exertional dyspne ais relatively stable. Leg swelling has resovled with compression stockings.     Current Outpatient Medications:    aspirin EC 81 MG tablet, Take 1 tablet (81 mg total) by mouth daily., Disp: 90 tablet, Rfl: 3   Budeson-Glycopyrrol-Formoterol (BREZTRI AEROSPHERE) 160-9-4.8 MCG/ACT AERO, Inhale 2 puffs into the lungs in the morning and at bedtime., Disp: 10.7 g, Rfl: 6   hydrALAZINE (APRESOLINE) 50 MG tablet, Take 1 tablet (50 mg total) by mouth 3 (three) times daily., Disp: 90 tablet, Rfl: 3   losartan-hydrochlorothiazide (HYZAAR) 100-12.5 MG tablet, Take 1 tablet by mouth daily., Disp: , Rfl:    metoprolol succinate (TOPROL-XL) 100 MG 24 hr tablet, Take 100 mg by mouth daily., Disp: , Rfl:    REPATHA SURECLICK 140 MG/ML SOAJ, SMARTSIG:140 Milligram(s) SUB-Q Every 2 Weeks, Disp: , Rfl:    rosuvastatin (CRESTOR) 20 MG tablet, Take 20 mg by mouth at bedtime., Disp: , Rfl:   Cardiovascular and other pertinent studies:  Echocardiogram 01/19/2022: Normal LV systolic function with visual EF 60-65%. Left ventricle cavity is normal in size. Normal left ventricular wall thickness. Normal global wall motion. Normal diastolic filling pattern, normal LAP. Aortic valve sclerosis without stenosis. Mild to moderate mitral regurgitation. Compared to 03/23/2020 G1DD is now normal, mild MR is now mild/moderate, otherwise no significant change. I personally reviewed the echocardiogram.  LV cavity is not dilated, and is normal.  EKG 01/06/2022: Sinus rhythm 63  bpm Occasional PAC   Left atrial enlargement Nonspecific T wave abnormality  CT Angio Neck 04/05/2019: Occlusion of the cervical left ICA. At least partial reconstitution intracranially at the level of the proximal cavernous segment. Remainder of intracranial portion is not included. Plaque at the right ICA origin with approximately 50% stenosis. Nonspecific left maxillary sinus air-fluid level, which can reflect acute sinusitis in the appropriate clinical setting.   ABI 02/19/2019:  This exam reveals normal perfusion of the right lower extremity (ABI 1.00). This exam reveals normal perfusion of the left lower extremity (ABI 1.00).   Lexiscan Sestamibi Stress Test 02/18/2019: Nondiagnostic ECG stress due to pharmacologic stress. oft tissue attenuation in the anterior wall. Normal myocardial perfusion. without ischemia or scar All segments of left ventricle demonstrated normal wall motion and thickening. Stress LV EF is normal 57%.  No previous exam available for comparison. Low risk study.   CT head 01/2019: Possible left frontal lobe acute infarct   Recent labs: 09/28/2022: Glucose 96, BUN/Cr 24/1.33. eGFR 61. Na/K 141/4.3.   12/18/2021: Glucose 99, BUN/Cr 32/1.4. EGFR 58. Na/K 140/5.3. Rest of the CMP normal H/H 14/44. MCV 91. Platelets 220 HbA1C 5.9% Chol 68, TG 70, HDL 33, LDL 9   12/15/2020: Glucose 99, BUN/Cr 27/1.49. EGFR 54. Na/K 144/5.3. Rest of the CMP normal H/H 15/46. MCV 92. Platelets 224 HbA1C 6.0% Chol 143, TG 99, HDL 40, LDL 84 TSH 1.1 normal  03/17/2019: Glucose 106, BUN/Cr 27/1.24. EGFR 64. Na/K 142/4.6. Rest of the CMP normal Chol 153, TG 121, HDL 41, LDL 90  07/18/2019: BNP  9 normal  01/21/2019: Glucose 107, BUN/Cr 21/1.1. eGFR >60. Na/K 139/4.4. Rest of the CMP normal. H/H 15.7/46.8. Platelets 162    Review of Systems  Cardiovascular:  Positive for dyspnea on exertion (Mild, stable). Negative for chest pain, leg swelling, palpitations and  syncope.         Vitals:   10/06/22 0833  BP: 136/73  Pulse: 67  Resp: 16  SpO2: 94%      Body mass index is 43.79 kg/m. Filed Weights   10/06/22 0833  Weight: 288 lb (130.6 kg)      Objective:   Physical Exam Vitals and nursing note reviewed.  Constitutional:      Appearance: He is well-developed.  Neck:     Vascular: No JVD.  Cardiovascular:     Rate and Rhythm: Normal rate and regular rhythm.     Pulses:          Carotid pulses are  on the right side with bruit and  on the left side with bruit.      Dorsalis pedis pulses are 1+ on the right side and 1+ on the left side.       Posterior tibial pulses are 0 on the right side and 0 on the left side.     Heart sounds: Murmur heard.     Harsh midsystolic murmur is present with a grade of 2/6 at the upper right sternal border radiating to the neck.  Pulmonary:     Effort: Pulmonary effort is normal.     Breath sounds: Normal breath sounds. No wheezing or rales.  Musculoskeletal:     Right lower leg: No edema.     Left lower leg: No edema.         Assessment & Recommendations:   62 y.o. male with hypertension, former smoker, stroke (01/2019), OSA not on CPAP  Hypertension: Controlled. No changes mad today.  Exertional dyspnea: Likely multifactorial, but no florid heart failure or angina equivalent. Continue f/u w/pulmonary.  H/o stroke: Known occluded left carotid.   Moderate right carotid stenosis.   Continue risk factor modification.   Lipids very well controlled Continue aspirin 81 mg daily, Repatha. No on statin with ery controled lipids on Repatha.  Murmur: Aortic sclerosis, mid to mod MR. Clinically non-significant.  F/u in 1 year    Elder Negus, MD Pager: [phone] Office: [phone]

## 2022-10-07 ENCOUNTER — Ambulatory Visit: Payer: BC Managed Care – PPO | Admitting: Cardiology

## 2022-12-09 DIAGNOSIS — R001 Bradycardia, unspecified: Secondary | ICD-10-CM | POA: Diagnosis not present

## 2022-12-09 DIAGNOSIS — R531 Weakness: Secondary | ICD-10-CM | POA: Diagnosis not present

## 2022-12-09 DIAGNOSIS — I959 Hypotension, unspecified: Secondary | ICD-10-CM | POA: Diagnosis not present

## 2022-12-09 DIAGNOSIS — Z20822 Contact with and (suspected) exposure to covid-19: Secondary | ICD-10-CM | POA: Diagnosis not present

## 2022-12-09 DIAGNOSIS — R55 Syncope and collapse: Secondary | ICD-10-CM | POA: Diagnosis not present

## 2022-12-09 DIAGNOSIS — E860 Dehydration: Secondary | ICD-10-CM | POA: Diagnosis not present

## 2022-12-09 DIAGNOSIS — R42 Dizziness and giddiness: Secondary | ICD-10-CM | POA: Diagnosis not present

## 2023-02-22 NOTE — Progress Notes (Signed)
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  Therefore, he will have to continue taking care when he stands up - stop and take a moment before starting to walk. Recommend systolic blood pressure goal to be 130s-140s to ensure adequate cerebral perfusion Continue other secondary stroke prevention management as per PCP and cardiology: ASA 81mg  daily On Repatha.  LDL goal less than 70 Hgb M5H goal less than 7 Follow up as needed.    Subjective:  Francisco Gross is a 63 year old right-handed male with HTN, CKD, HLD, peripheral neuropathy, and history of kidney stones and stroke who presents for abnormal involuntary movements.   History supplemented by referring provider's note.  CTA head and neck personally reviewed.  Patient had a left hemispheric stroke secondary to left ICA occlusion in December 2020 presenting only with transient speech difficulty, dizziness and unsteady gait.  Since then, he notes transient shaking or jerking of the right hand or leg when he stands up or just starts walking upon standing.  Associated with dizziness/lightheadedness.  Lasts a few seconds.  Also, when he looks up at a bright light or the sun, he may lose vision in his left eye which will resolve once he gets out of the light.  He has previously had uncontrolled blood pressure.  It is now  more stable, usually 130s-150s systolic.  Following the stroke he was diagnosed with CHF and COPD.  He is followed by cardiology.  He hasn't smoked since the stroke.    04/05/2019 CTA HEAD & NECK:  Occlusion of the cervical left ICA. At least partial reconstitution intracranially at the level of the proximal cavernous segment.  Remainder of intracranial portion is not included.  Plaque at the right ICA origin with approximately 50% stenosis.  Nonspecific left maxillary sinus air-fluid level, which can reflect acute sinusitis in the appropriate clinical setting.   PAST MEDICAL HISTORY: Past Medical History:  Diagnosis Date   Chronic kidney disease    kidney stones    Hyperlipidemia    Hypertension    Stroke [facility])     PAST SURGICAL HISTORY: Past Surgical History:  Procedure Laterality Date   basket retrieval of kidney stone     LITHOTRIPSY      MEDICATIONS: Current Outpatient Medications on File Prior to Visit  Medication Sig Dispense Refill   amLODipine (NORVASC) 10 MG tablet Take 10 mg by mouth daily.     aspirin EC 81 MG tablet Take 1 tablet (81 mg total) by mouth daily. 90 tablet 3   Budeson-Glycopyrrol-Formoterol (BREZTRI AEROSPHERE) 160-9-4.8 MCG/ACT AERO Inhale 2 puffs into the lungs in the morning and at bedtime. 10.7 g 6   hydrALAZINE (APRESOLINE) 50 MG tablet Take 1 tablet (50 mg total) by mouth 3 (three) times daily. (Patient taking differently: Take 150 mg by mouth 3 (three) times daily.) 90 tablet 3   metoprolol succinate (TOPROL-XL) 100 MG 24 hr tablet Take 100 mg by mouth daily.  REPATHA SURECLICK 140 MG/ML SOAJ SMARTSIG:140 Milligram(s) SUB-Q Every 2 Weeks     No current facility-administered medications on file prior to visit.    ALLERGIES: No Known Allergies  FAMILY HISTORY: Family History  Problem Relation Age of Onset   Breast cancer Mother    Lung cancer Father    Breast cancer Sister    Colon cancer Neg Hx    Rectal cancer Neg Hx    Stomach cancer  Neg Hx     Objective:  Blood pressure 133/65, pulse 62, height 5\' 8"  (1.727 m), weight 288 lb (130.6 kg), SpO2 (!) 65%. General: No acute distress.  Patient appears well-groomed.   Head:  Normocephalic/atraumatic Eyes:  fundi examined but not visualized Neck: supple, no paraspinal tenderness, full range of motion Back: No paraspinal tenderness Heart: regular rate and rhythm Lungs: Clear to auscultation bilaterally. Vascular: No carotid bruits. Neurological Exam: Mental status: alert and oriented to person, place, and time, speech fluent and not dysarthric, language intact. Cranial nerves: CN I: not tested CN II: pupils equal, round and reactive to light, visual fields intact CN III, IV, VI:  full range of motion, no nystagmus, no ptosis CN V: facial sensation intact. CN VII: upper and lower face symmetric CN VIII: hearing intact CN IX, X: gag intact, uvula midline CN XI: sternocleidomastoid and trapezius muscles intact CN XII: tongue midline Bulk & Tone: normal, no fasciculations. Motor:  muscle strength 5/5 throughout Sensation:  Pinprick, temperature and vibratory sensation intact. Deep Tendon Reflexes:  2+ throughout,  toes downgoing.   Finger to nose testing:  Without dysmetria.   Heel to shin:  Without dysmetria.   Gait:  Normal station and stride.  Romberg negative.    Thank you for allowing me to take part in the care of this patient.  Shon Millet, DO  CC: Elson Clan, FNP

## 2023-02-23 ENCOUNTER — Encounter: Payer: Self-pay | Admitting: Neurology

## 2023-02-23 ENCOUNTER — Ambulatory Visit: Payer: BC Managed Care – PPO | Admitting: Neurology

## 2023-02-23 VITALS — BP 133/65 | HR 62 | Ht 68.0 in | Wt 288.0 lb

## 2023-02-23 DIAGNOSIS — G451 Carotid artery syndrome (hemispheric): Secondary | ICD-10-CM

## 2023-02-23 DIAGNOSIS — I63232 Cerebral infarction due to unspecified occlusion or stenosis of left carotid arteries: Secondary | ICD-10-CM | POA: Diagnosis not present

## 2023-02-23 DIAGNOSIS — I10 Essential (primary) hypertension: Secondary | ICD-10-CM | POA: Diagnosis not present

## 2023-02-23 DIAGNOSIS — E782 Mixed hyperlipidemia: Secondary | ICD-10-CM

## 2023-02-23 DIAGNOSIS — Z87891 Personal history of nicotine dependence: Secondary | ICD-10-CM

## 2023-02-23 NOTE — Patient Instructions (Signed)
The shaking occurs because when you stand up, you have dysregulation of her blood pressure that causes exacerbation of the old stroke symptoms.  There isn't anything we can do to correct that.  Would be careful when you stand up and start to walk.  Would recommend keeping top blood pressure number in the 130s-140s.

## 2023-04-12 ENCOUNTER — Other Ambulatory Visit: Payer: Self-pay | Admitting: Pulmonary Disease

## 2023-06-01 ENCOUNTER — Encounter: Payer: Self-pay | Admitting: Pulmonary Disease

## 2023-06-01 ENCOUNTER — Ambulatory Visit: Admitting: Pulmonary Disease

## 2023-06-01 VITALS — BP 143/63 | HR 55 | Ht 68.0 in | Wt 286.0 lb

## 2023-06-01 DIAGNOSIS — J449 Chronic obstructive pulmonary disease, unspecified: Secondary | ICD-10-CM | POA: Diagnosis not present

## 2023-06-01 DIAGNOSIS — E669 Obesity, unspecified: Secondary | ICD-10-CM

## 2023-06-01 DIAGNOSIS — Z87891 Personal history of nicotine dependence: Secondary | ICD-10-CM | POA: Diagnosis not present

## 2023-06-01 DIAGNOSIS — G4733 Obstructive sleep apnea (adult) (pediatric): Secondary | ICD-10-CM | POA: Diagnosis not present

## 2023-06-01 DIAGNOSIS — Z6841 Body Mass Index (BMI) 40.0 and over, adult: Secondary | ICD-10-CM

## 2023-06-01 MED ORDER — ALBUTEROL SULFATE HFA 108 (90 BASE) MCG/ACT IN AERS: 2.0000 | INHALATION_SPRAY | Freq: Four times a day (QID) | RESPIRATORY_TRACT | 6 refills | Status: AC | PRN

## 2023-06-01 MED ORDER — BREZTRI AEROSPHERE 160-9-4.8 MCG/ACT IN AERO
2.0000 | INHALATION_SPRAY | Freq: Two times a day (BID) | RESPIRATORY_TRACT | 12 refills | Status: AC
Start: 2023-06-01 — End: ?

## 2023-06-01 NOTE — Patient Instructions (Addendum)
 Continue breztri  inhaler 2 puffs twice daily - rinse mouth out after each use  Use albuterol  inhaler 1-2 puffs every 4-6 hours as needed  Continue CPAP nightly  Schedule your lung cancer screening CT Chest  Follow up in 1 year, call sooner if needed

## 2023-06-01 NOTE — Progress Notes (Unsigned)
 Synopsis: Referred in February 2023 for COPD by Benn Brash, NP  Subjective:   PATIENT ID: Francisco Gross GENDER: male DOB: Jun 25, 1960, MRN: [id number]  HPI  Chief Complaint  Patient presents with   Follow-up    Pt state notice more SOB while working    Francisco Gross is a 63 year old male, former smoker with history of CKD, HLD, CVA and sleep apnea who returns to pulmonary clinic for COPD.   He experiences shortness of breath, particularly during physical activities such as work and showering. His breathing difficulties are exacerbated by temperature changes and physical exertion, with symptoms worsening in hot and muggy weather, as well as during allergy season. He describes a sensation of 'running out of air'.  He is currently using Breztri  inhaler, two puffs twice a day, which he finds effective for a while but then experiences a recurrence of symptoms. He has not been using an albuterol  inhaler since switching to Breztri . His prescription for Breztri  was recently refilled with multiple refills available.  He uses a CPAP machine a couple of times a week but finds it difficult to use consistently. He has not had any hospitalizations or urgent care visits for his breathing issues in the past year.  He reports a change in his voice, describing it as sometimes requiring clearing, but he does rinse his mouth after using the inhaler. He works in a dusty environment, which may contribute to his symptoms.  OV 06/06/22 He started CPAP in 02/2022. His blood pressure has improved a bit. He is not using it every night. The stiolto does not seem to help with his exertional dyspnea. He denies cough or wheezing. He is having to take a break after getting a shower. He is not doing any organized exercise. His weight has been stable with BMI 43.  OV 05/04/22 He was provided trial of stiolto at last visit with improvement in his breathing. He has lost 8lbs since last visit as he cut soda out of his diet.    PFTs today show non-specific pulmonary function pattern with air trapping and normal DLCO.   OV 03/10/21 He reports shortness of breath over the past 3 years that has been progressive. He denies cough or wheezing. He has gained 60lbs in the last 3 years. He drinks 20-40oz of mountain dew daily. He had a stroke in 2020 and quit smoking at that time. He has a 40+ pack year smoking history.   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   He has CPAP machine at home but does not use it regularly due to issues with mask fit and having it come off at various times during the night.   He had secondhand smoke exposure. His father died of lung cancer. Mother had bone cancer and a sister had breast cancer.    Past Medical History:  Diagnosis Date   Chronic kidney disease    kidney stones    Hyperlipidemia    Hypertension    Stroke [facility])      Family History  Problem Relation Age of Onset   Migraines Mother    Breast cancer Mother    Lung cancer Father    Breast cancer Sister    Colon cancer Neg Hx    Rectal cancer Neg Hx    Stomach cancer Neg Hx      Social History   Socioeconomic History  Marital status: Married    Spouse name: Not on file   Number of children: 2   Years of education: Not on file   Highest education level: Not on file  Occupational History   Not on file  Tobacco Use   Smoking status: Former    Current packs/day: 0.00    Average packs/day: 2.0 packs/day for 43.4 years (86.9 ttl pk-yrs)    Types: Cigarettes    Start date: 08/09/1975    Quit date: 01/18/2019    Years since quitting: 4.3   Smokeless tobacco: Never  Vaping Use   Vaping status: Never Used  Substance and Sexual Activity   Alcohol  use: Not Currently    Alcohol /week: 0.0 standard drinks of alcohol    Drug use: No   Sexual activity: Not on file  Other Topics Concern   Not on file   Social History Narrative   Right handed   One story home   Drinks one cup of coffe daily   Social Drivers of Health   Financial Resource Strain: Not on file  Food Insecurity: Not on file  Transportation Needs: Not on file  Physical Activity: Not on file  Stress: Not on file  Social Connections: Unknown (01/04/2022)   Received from [facility], [facility]   Social Network    Social Network: Not on file  Intimate Partner Violence: Unknown (01/04/2022)   Received from [facility], [facility]   HITS    Physically Hurt: Not on file    Insult or Talk Down To: Not on file    Threaten Physical Harm: Not on file    Scream or Curse: Not on file     No Known Allergies   Outpatient Medications Prior to Visit  Medication Sig Dispense Refill   amLODipine  (NORVASC ) 10 MG tablet Take 10 mg by mouth daily.     aspirin  EC 81 MG tablet Take 1 tablet (81 mg total) by mouth daily. 90 tablet 3   hydrALAZINE  (APRESOLINE ) 50 MG tablet Take 1 tablet (50 mg total) by mouth 3 (three) times daily. (Patient taking differently: Take 150 mg by mouth 3 (three) times daily.) 90 tablet 3   metoprolol succinate (TOPROL-XL) 100 MG 24 hr tablet Take 100 mg by mouth daily.     REPATHA SURECLICK 140 MG/ML SOAJ SMARTSIG:140 Milligram(s) SUB-Q Every 2 Weeks     BREZTRI  AEROSPHERE 160-9-4.8 MCG/ACT AERO INHALE 2 PUFFS INTO THE LUNGS IN THE MORNING AND AT BEDTIME 10.7 g 6   No facility-administered medications prior to visit.    Review of Systems  Constitutional:  Negative for chills, fever, malaise/fatigue and weight loss.  HENT:  Negative for congestion, sinus pain and sore throat.   Eyes: Negative.   Respiratory:  Positive for shortness of breath. Negative for cough, hemoptysis, sputum production and wheezing.   Cardiovascular:  Negative for chest pain, palpitations, orthopnea, claudication and leg swelling.  Gastrointestinal:  Negative for abdominal pain, heartburn, nausea and vomiting.   Genitourinary: Negative.   Musculoskeletal:  Negative for joint pain and myalgias.  Skin:  Negative for rash.  Neurological:  Negative for weakness.  Endo/Heme/Allergies: Negative.   Psychiatric/Behavioral: Negative.     Objective:   Vitals:   06/01/23 1454  BP: (!) 143/63  Pulse: (!) 55  Weight: 286 lb (129.7 kg)  Height: 5\' 8"  (1.727 m)    Physical Exam Constitutional:      General: He is not in acute distress.    Appearance: He is obese.  HENT:     Head: Normocephalic and atraumatic.  Eyes:     Conjunctiva/sclera: Conjunctivae normal.  Cardiovascular:     Rate and Rhythm: Normal rate and regular rhythm.     Pulses: Normal pulses.     Heart sounds: Normal heart sounds. No murmur heard. Pulmonary:     Effort: Pulmonary effort is normal.     Breath sounds: Normal breath sounds. No wheezing, rhonchi or rales.  Musculoskeletal:     Right lower leg: No edema.     Left lower leg: No edema.  Skin:    General: Skin is warm and dry.  Neurological:     General: No focal deficit present.     Mental Status: He is alert.    CBC No results found for: "WBC", "RBC", "HGB", "HCT", "PLT", "MCV", "MCH", "MCHC", "RDW", "LYMPHSABS", "MONOABS", "EOSABS", "BASOSABS"  Chest imaging: CT Chest [facility] 07/07/22 Mediastinum/Nodes: No pathologically enlarged mediastinal or hilar lymph nodes. Please note that accurate exclusion of hilar adenopathy is limited on noncontrast CT scans. Esophagus is unremarkable in appearance. No axillary lymphadenopathy.   Lungs/Pleura: No suspicious appearing pulmonary nodules or masses are noted. No acute consolidative airspace disease. No pleural effusions. Mild diffuse bronchial wall thickening with very mild centrilobular and paraseptal emphysema.  CXR 05/26/22 The heart size and mediastinal contours are within normal limits. Both lungs are clear. The visualized skeletal structures are stable.  PFT:    Latest Ref Rng & Units 05/03/2021    2:46 PM   PFT Results  FVC-Pre L 2.71   FVC-Predicted Pre % 60   FVC-Post L 2.85   FVC-Predicted Post % 63   Pre FEV1/FVC % % 78   Post FEV1/FCV % % 77   FEV1-Pre L 2.13   FEV1-Predicted Pre % 62   FEV1-Post L 2.20   DLCO uncorrected ml/min/mmHg 26.75   DLCO UNC% % 102   DLCO corrected ml/min/mmHg 26.75   DLCO COR %Predicted % 102   DLVA Predicted % 125   TLC L 6.01   TLC % Predicted % 91   RV % Predicted % 135   PFTs: non-specific pulmonary function with air trapping present and normal DLCO  Labs:  Path:  Echo 2023: LV EF 60-65%. Normal diastolic pattern. Mild to moderate mitral regurgitation.   Heart Catheterization:  Assessment & Plan:   Chronic obstructive pulmonary disease, unspecified COPD type (HCC) - Plan: budeson-glycopyrrolate-formoterol (BREZTRI  AEROSPHERE) 160-9-4.8 MCG/ACT AERO inhaler, albuterol  (VENTOLIN  HFA) 108 (90 Base) MCG/ACT inhaler  Obstructive sleep apnea syndrome  Former smoker  Discussion: Francisco Gross is a 63 year old male, former smoker with history of CKD, HLD, CVA and sleep apnea who returns to pulmonary clinic for COPD.   Chronic obstructive pulmonary disease (COPD) - Continue Breztri  inhaler, two puffs twice daily. - Prescribe albuterol  inhaler for use as needed, one to two puffs every four to six hours. - Ensure follow-up on lung cancer screening CT scan.  OSA - Encourage continued use of CPAP.  Obesity - Encouraged further weight reduction and starting a consistent exercise regimen.  Follow up in 1 year.  Francisco German, MD [facility] Office: [phone]   Current Outpatient Medications:    albuterol  (VENTOLIN  HFA) 108 (90 Base) MCG/ACT inhaler, Inhale 2 puffs into the lungs every 6 (six) hours as needed for wheezing or shortness of breath., Disp: 8 g, Rfl: 6   amLODipine  (NORVASC ) 10 MG tablet, Take 10 mg by mouth daily., Disp: , Rfl:  aspirin  EC 81 MG tablet, Take 1 tablet (81 mg total) by mouth daily.,  Disp: 90 tablet, Rfl: 3   hydrALAZINE  (APRESOLINE ) 50 MG tablet, Take 1 tablet (50 mg total) by mouth 3 (three) times daily. (Patient taking differently: Take 150 mg by mouth 3 (three) times daily.), Disp: 90 tablet, Rfl: 3   metoprolol succinate (TOPROL-XL) 100 MG 24 hr tablet, Take 100 mg by mouth daily., Disp: , Rfl:    REPATHA SURECLICK 140 MG/ML SOAJ, SMARTSIG:140 Milligram(s) SUB-Q Every 2 Weeks, Disp: , Rfl:    budeson-glycopyrrolate-formoterol (BREZTRI  AEROSPHERE) 160-9-4.8 MCG/ACT AERO inhaler, Inhale 2 puffs into the lungs in the morning and at bedtime., Disp: 10.7 g, Rfl: 12

## 2023-06-02 ENCOUNTER — Encounter: Payer: Self-pay | Admitting: Pulmonary Disease

## 2023-07-11 ENCOUNTER — Other Ambulatory Visit: Payer: Self-pay | Admitting: Acute Care

## 2023-07-11 DIAGNOSIS — Z122 Encounter for screening for malignant neoplasm of respiratory organs: Secondary | ICD-10-CM

## 2023-07-11 DIAGNOSIS — Z87891 Personal history of nicotine dependence: Secondary | ICD-10-CM

## 2023-07-11 DIAGNOSIS — F17210 Nicotine dependence, cigarettes, uncomplicated: Secondary | ICD-10-CM

## 2023-08-01 ENCOUNTER — Ambulatory Visit
Admission: RE | Admit: 2023-08-01 | Discharge: 2023-08-01 | Disposition: A | Discharge status: Home / Self Care | Source: Ambulatory Visit | Attending: Acute Care | Admitting: Acute Care

## 2023-08-01 DIAGNOSIS — F17210 Nicotine dependence, cigarettes, uncomplicated: Secondary | ICD-10-CM

## 2023-08-01 DIAGNOSIS — Z87891 Personal history of nicotine dependence: Secondary | ICD-10-CM

## 2023-08-01 DIAGNOSIS — Z122 Encounter for screening for malignant neoplasm of respiratory organs: Secondary | ICD-10-CM

## 2023-08-15 ENCOUNTER — Other Ambulatory Visit: Payer: Self-pay | Admitting: Acute Care

## 2023-08-15 DIAGNOSIS — Z122 Encounter for screening for malignant neoplasm of respiratory organs: Secondary | ICD-10-CM

## 2023-08-15 DIAGNOSIS — Z87891 Personal history of nicotine dependence: Secondary | ICD-10-CM

## 2023-10-06 ENCOUNTER — Ambulatory Visit: Payer: BC Managed Care – PPO | Admitting: Cardiology

## 2023-12-25 DIAGNOSIS — I6523 Occlusion and stenosis of bilateral carotid arteries: Secondary | ICD-10-CM | POA: Diagnosis not present

## 2024-01-01 ENCOUNTER — Other Ambulatory Visit: Payer: Self-pay

## 2024-01-01 DIAGNOSIS — I6523 Occlusion and stenosis of bilateral carotid arteries: Secondary | ICD-10-CM

## 2024-01-03 ENCOUNTER — Ambulatory Visit: Admitting: Vascular Surgery

## 2024-01-03 ENCOUNTER — Ambulatory Visit (HOSPITAL_COMMUNITY)
Admission: RE | Admit: 2024-01-03 | Discharge: 2024-01-03 | Disposition: A | Discharge status: Home / Self Care | Source: Ambulatory Visit | Attending: Vascular Surgery | Admitting: Vascular Surgery

## 2024-01-03 DIAGNOSIS — I6523 Occlusion and stenosis of bilateral carotid arteries: Secondary | ICD-10-CM | POA: Insufficient documentation

## 2024-01-09 DIAGNOSIS — N200 Calculus of kidney: Secondary | ICD-10-CM | POA: Diagnosis not present

## 2024-01-16 ENCOUNTER — Encounter: Payer: Self-pay | Admitting: Vascular Surgery

## 2024-01-16 ENCOUNTER — Ambulatory Visit: Attending: Vascular Surgery | Admitting: Vascular Surgery

## 2024-01-16 VITALS — BP 147/79 | HR 61 | Temp 98.6°F | Ht 68.0 in | Wt 293.0 lb

## 2024-01-16 DIAGNOSIS — I6523 Occlusion and stenosis of bilateral carotid arteries: Secondary | ICD-10-CM

## 2024-01-16 NOTE — Progress Notes (Signed)
 [facility] [place]  ASSESSMENT / PLAN: Francisco Gross is a 63 y.o. male with left carotid artery occlusion, right 60- 79% carotid artery stenosis.  Recommend:  Abstinence from all tobacco products. Blood glucose control with goal A1c < 7%. Blood pressure control with goal blood pressure < 130/80 mmHg. Lipid reduction therapy with goal LDL-C < 55 mg/dL. Aspirin  81mg  by mouth daily. Atorvastatin 40-80mg  PO QD (or other high intensity statin therapy).  Follow-up with me in 6 months with repeat carotid artery duplex  CHIEF COMPLAINT: Lower extremity pain  HISTORY OF PRESENT ILLNESS: Francisco Gross is a 63 y.o. male who presents to clinic for evaluation of possible peripheral arterial disease.  The patient reports he has radiating discomfort from his buttock down his leg which will occur when standing or walking for long period of time.  Stopping walking does not relieve his pain.  Changing from a standing to a seated position gives him quick relief of his pain.  He does not describe classic claudication symptoms.  He does not describe rest pain in his feet.  He has no ulcers about his feet.  01/16/24: Patient returns to clinic for evaluation of carotid artery stenosis.  The patient has a known occluded left carotid artery which has caused a stroke in the remote past.  Patient has no history of stroke or TIA in the last 6 months.  His primary care physician was performing surveillance of his carotid disease, but noted worsening and so referred him for further evaluation.  We reviewed a duplex performed in our lab in detail and I counseled him about the findings.  We will plan for short interval follow-up to monitor significant cerebrovascular disease.  Past Medical History:  Diagnosis Date   Chronic kidney disease    kidney stones    Hyperlipidemia    Hypertension    Stroke [facility])     Past Surgical History:  Procedure Laterality Date   basket retrieval of kidney  stone     LITHOTRIPSY      Family History  Problem Relation Age of Onset   Migraines Mother    Breast cancer Mother    Lung cancer Father    Breast cancer Sister    Colon cancer Neg Hx    Rectal cancer Neg Hx    Stomach cancer Neg Hx     Social History   Socioeconomic History   Marital status: Married    Spouse name: Not on file   Number of children: 2   Years of education: Not on file   Highest education level: Not on file  Occupational History   Not on file  Tobacco Use   Smoking status: Former    Current packs/day: 0.00    Average packs/day: 2.0 packs/day for 43.4 years (86.9 ttl pk-yrs)    Types: Cigarettes    Start date: 08/09/1975    Quit date: 01/18/2019    Years since quitting: 4.9   Smokeless tobacco: Never  Vaping Use   Vaping status: Never Used  Substance and Sexual Activity   Alcohol  use: Not Currently    Alcohol /week: 0.0 standard drinks of alcohol    Drug use: No   Sexual activity: Not on file  Other Topics Concern   Not on file  Social History Narrative   Right handed   One story home   Drinks one cup of coffe daily   Social Drivers of Health   Financial Resource Strain: Not on file  Food Insecurity: Not on file  Transportation Needs: Not on file  Physical Activity: Not on file  Stress: Not on file  Social Connections: Not on file  Intimate Partner Violence: Not on file    Allergies  Allergen Reactions   Angiotensin Receptor Blockers Other (See Comments)    Product containing angiotensin II receptor antagonist (product)   Fenofibrate Other (See Comments)    Current Outpatient Medications  Medication Sig Dispense Refill   albuterol  (VENTOLIN  HFA) 108 (90 Base) MCG/ACT inhaler Inhale 2 puffs into the lungs every 6 (six) hours as needed for wheezing or shortness of breath. 8 g 6   amLODipine  (NORVASC ) 10 MG tablet Take 10 mg by mouth daily.     aspirin  EC 81 MG tablet Take 1 tablet (81 mg total) by mouth daily. 90 tablet 3    budeson-glycopyrrolate-formoterol (BREZTRI  AEROSPHERE) 160-9-4.8 MCG/ACT AERO inhaler Inhale 2 puffs into the lungs in the morning and at bedtime. 10.7 g 12   hydrALAZINE  (APRESOLINE ) 50 MG tablet Take 1 tablet (50 mg total) by mouth 3 (three) times daily. (Patient taking differently: Take 150 mg by mouth 3 (three) times daily.) 90 tablet 3   metoprolol succinate (TOPROL-XL) 100 MG 24 hr tablet Take 100 mg by mouth daily.     REPATHA SURECLICK 140 MG/ML SOAJ SMARTSIG:140 Milligram(s) SUB-Q Every 2 Weeks     No current facility-administered medications for this visit.    PHYSICAL EXAM Vitals:   01/16/24 1538  BP: (!) 147/79  Pulse: 61  Temp: 98.6 F (37 C)  SpO2: 91%  Weight: 293 lb (132.9 kg)  Height: 5' 8 (1.727 m)    Middle-age man in no acute distress Regular rate and rhythm Unlabored breathing 2+ dorsalis pedis pulses bilaterally  PERTINENT LABORATORY AND RADIOLOGIC DATA  Patient Name:  Francisco Gross  Date of Exam:   01/03/2024  Medical Rec #: [id number]       Accession #:    [id number]  Date of Birth: Jun 05, 1960        Patient Gender: M  Patient Age:   14 years  Exam Location:  [address]  Procedure:      VAS US  CAROTID  Referring Phys: DEBBY ROBERTSON    ---------------------------------------------------------------------------  -----    Indications:      Bilateral carotid artery stenosis.  Risk Factors:      Hypertension, hyperlipidemia, past history of smoking.  Comparison Study:  04/09/20                     Stenosis in the right internal carotid artery (50-69%).                     Stenosis in the                     right external carotid artery (<50%).                    Left internal carotid artery is occluded. (Confirmed by  prior                    CTA).                     Stenosis in the left external carotid artery (>50%).                    Right vertebral artery flow is not visualized. Left  vertebral                    artery flow                      is not visualized.   Performing Technologist: Dena Pane     Examination Guidelines: A complete evaluation includes B-mode imaging,  spectral  Doppler, color Doppler, and power Doppler as needed of all accessible  portions  of each vessel. Bilateral testing is considered an integral part of a  complete  examination. Limited examinations for reoccurring indications may be  performed  as noted.     Right Carotid Findings:  +----------+--------+--------+--------+------------------+-----------------  -+           PSV cm/sEDV cm/sStenosisPlaque DescriptionComments             +----------+--------+--------+--------+------------------+-----------------  -+  CCA Prox  151     29                                intimal  thickening  +----------+--------+--------+--------+------------------+-----------------  -+  CCA Distal96      14      <50%    heterogenous                           +----------+--------+--------+--------+------------------+-----------------  -+  ICA Prox  297     92      60-79%  heterogenous                           +----------+--------+--------+--------+------------------+-----------------  -+  ICA Mid   191     55      40-59%                                         +----------+--------+--------+--------+------------------+-----------------  -+  ICA Distal96      26                                                     +----------+--------+--------+--------+------------------+-----------------  -+  ECA      136     18              heterogenous                           +----------+--------+--------+--------+------------------+-----------------  -+   +----------+--------+-------+--------+-------------------+           PSV cm/sEDV cmsDescribeArm Pressure (mmHG)  +----------+--------+-------+--------+-------------------+  Subclavian148    0              168                   +----------+--------+-------+--------+-------------------+   +---------+--------+--+--------+--+------------------+  VertebralPSV cm/s49EDV cm/s15Pre-steal waveform  +---------+--------+--+--------+--+------------------+   Left Carotid Findings:  +----------+--------+--------+--------+------------------+-----------------  -+           PSV cm/sEDV cm/sStenosisPlaque DescriptionComments             +----------+--------+--------+--------+------------------+-----------------  -+  CCA Prox  102     0  intimal  thickening  +----------+--------+--------+--------+------------------+-----------------  -+  CCA Distal27      6                                 intimal  thickening  +----------+--------+--------+--------+------------------+-----------------  -+  ICA Prox                  Occluded                                       +----------+--------+--------+--------+------------------+-----------------  -+  ICA Mid                   Occluded                                       +----------+--------+--------+--------+------------------+-----------------  -+  ICA Distal                Occluded                                       +----------+--------+--------+--------+------------------+-----------------  -+  ECA      364     52      >50%    heterogenous                           +----------+--------+--------+--------+------------------+-----------------  -+   +----------+--------+--------+---------+-------------------+           PSV cm/sEDV cm/sDescribe Arm Pressure (mmHG)  +----------+--------+--------+---------+-------------------+  Subclavian235    0       Turbulent                     +----------+--------+--------+---------+-------------------+   +---------+--------+--+--------+--+---------+  VertebralPSV cm/s62EDV cm/s12Antegrade   +---------+--------+--+--------+--+---------+   Unable to take blood pressure in left arm. Left side is the patient's  stroke side.     Summary:  Right Carotid: Velocities in the right ICA are consistent with a 60-79%                 stenosis. Non-hemodynamically significant plaque <50% noted  in                the CCA. The ECA appears <50% stenosed.   Left Carotid: Evidence consistent with a total occlusion of the left ICA.  The               ECA appears >50% stenosed.   Vertebrals:  Left vertebral artery demonstrates antegrade flow. Pre-steal               waveform is visualized in the right vertebral artery.  Subclavians: Left subclavian artery flow was disturbed. Questioning more               proximal stenosis in the right subclavian artery.   *See table(s) above for measurements and observations.      Electronically signed by Penne Colorado MD on 01/03/2024 at 1:46:46 PM.   Debby SAILOR. Magda, MD [facility] Phone Number: [phone] 01/16/2024 5:13 PM   Total time spent on preparing this encounter including chart review, data review, collecting history, examining  the patient, coordinating care for this new patient, 45 minutes.  Portions of this report may have been transcribed using voice recognition software.  Every effort has been made to ensure accuracy; however, inadvertent computerized transcription errors may still be present.

## 2024-01-17 ENCOUNTER — Other Ambulatory Visit: Payer: Self-pay

## 2024-01-17 DIAGNOSIS — I6523 Occlusion and stenosis of bilateral carotid arteries: Secondary | ICD-10-CM

## 2024-04-04 ENCOUNTER — Ambulatory Visit: Admitting: Cardiology

## 2024-07-30 ENCOUNTER — Ambulatory Visit (HOSPITAL_COMMUNITY)

## 2024-07-30 ENCOUNTER — Ambulatory Visit: Admitting: Vascular Surgery
